# Patient Record
Sex: Female | Born: 1985 | Race: Black or African American | Hispanic: No | Marital: Married | Smoking: Never smoker
Health system: Southern US, Community
[De-identification: ages and names within clinical notes are randomized; demographics above are authoritative.]

## PROBLEM LIST (undated history)

## (undated) DIAGNOSIS — T7840XA Allergy, unspecified, initial encounter: Secondary | ICD-10-CM

## (undated) DIAGNOSIS — F32A Depression, unspecified: Secondary | ICD-10-CM

## (undated) DIAGNOSIS — F419 Anxiety disorder, unspecified: Secondary | ICD-10-CM

## (undated) HISTORY — DX: Allergy, unspecified, initial encounter: T78.40XA

## (undated) HISTORY — DX: Depression, unspecified: F32.A

## (undated) HISTORY — DX: Anxiety disorder, unspecified: F41.9

## (undated) HISTORY — PX: SALPINGECTOMY: SHX328

---

## 2011-12-31 DIAGNOSIS — E669 Obesity, unspecified: Secondary | ICD-10-CM | POA: Insufficient documentation

## 2012-01-10 DIAGNOSIS — E559 Vitamin D deficiency, unspecified: Secondary | ICD-10-CM | POA: Insufficient documentation

## 2015-03-28 DIAGNOSIS — Z98891 History of uterine scar from previous surgery: Secondary | ICD-10-CM | POA: Insufficient documentation

## 2016-06-21 DIAGNOSIS — F411 Generalized anxiety disorder: Secondary | ICD-10-CM | POA: Insufficient documentation

## 2016-06-21 DIAGNOSIS — F5104 Psychophysiologic insomnia: Secondary | ICD-10-CM | POA: Insufficient documentation

## 2016-06-21 DIAGNOSIS — F330 Major depressive disorder, recurrent, mild: Secondary | ICD-10-CM | POA: Insufficient documentation

## 2016-06-21 DIAGNOSIS — F329 Major depressive disorder, single episode, unspecified: Secondary | ICD-10-CM | POA: Insufficient documentation

## 2016-10-11 ENCOUNTER — Encounter (HOSPITAL_BASED_OUTPATIENT_CLINIC_OR_DEPARTMENT_OTHER): Payer: Self-pay | Admitting: Emergency Medicine

## 2016-10-11 ENCOUNTER — Emergency Department (HOSPITAL_BASED_OUTPATIENT_CLINIC_OR_DEPARTMENT_OTHER)
Admission: EM | Admit: 2016-10-11 | Discharge: 2016-10-11 | Discharge status: Against Medical Advice | Payer: Self-pay | Attending: Emergency Medicine | Admitting: Emergency Medicine

## 2016-10-11 ENCOUNTER — Emergency Department (HOSPITAL_BASED_OUTPATIENT_CLINIC_OR_DEPARTMENT_OTHER): Payer: Self-pay

## 2016-10-11 DIAGNOSIS — Z3490 Encounter for supervision of normal pregnancy, unspecified, unspecified trimester: Secondary | ICD-10-CM

## 2016-10-11 DIAGNOSIS — R42 Dizziness and giddiness: Secondary | ICD-10-CM | POA: Insufficient documentation

## 2016-10-11 DIAGNOSIS — R110 Nausea: Secondary | ICD-10-CM | POA: Insufficient documentation

## 2016-10-11 DIAGNOSIS — O26891 Other specified pregnancy related conditions, first trimester: Secondary | ICD-10-CM | POA: Insufficient documentation

## 2016-10-11 DIAGNOSIS — R0602 Shortness of breath: Secondary | ICD-10-CM | POA: Insufficient documentation

## 2016-10-11 DIAGNOSIS — Z3A01 Less than 8 weeks gestation of pregnancy: Secondary | ICD-10-CM | POA: Insufficient documentation

## 2016-10-11 LAB — URINALYSIS, ROUTINE W REFLEX MICROSCOPIC
BILIRUBIN URINE: NEGATIVE
GLUCOSE, UA: NEGATIVE mg/dL
HGB URINE DIPSTICK: NEGATIVE
Ketones, ur: NEGATIVE mg/dL
Leukocytes, UA: NEGATIVE
Nitrite: NEGATIVE
PH: 6 (ref 5.0–8.0)
Protein, ur: NEGATIVE mg/dL
SPECIFIC GRAVITY, URINE: 1.018 (ref 1.005–1.030)

## 2016-10-11 LAB — PREGNANCY, URINE: Preg Test, Ur: POSITIVE — AB

## 2016-10-11 MED ORDER — SODIUM CHLORIDE 0.9 % IV BOLUS (SEPSIS): 1000.0000 mL | Freq: Once | INTRAVENOUS | Status: DC

## 2016-10-11 NOTE — Discharge Instructions (Signed)
It was advised that you stay in the ER and complete your workup.  Other sources of dizziness and shortness of breath are not ruled out.   These other causes include, but are not limited to hypotension, pulmonary embolism, and/or anemia.  You need to return to the ER if you have worsening symptoms, chest pain, shortness of breath, fever, abdominal pain, or bleeding.

## 2016-10-11 NOTE — ED Provider Notes (Signed)
[facility] Note   CSN: [id number] Arrival date & time: 10/11/16  1940  By signing my name below, I, Modena JanskyAlbert Thayil, attest that this documentation has been prepared under the direction and in the presence of non-physician practitioner, Roxy Horsemanobert Nickolus Wadding, PA-C. Electronically Signed: Modena JanskyAlbert Thayil, Scribe. 10/11/2016. 9:33 PM.  History   Chief Complaint Chief Complaint  Patient presents with  . Dizziness  . Shortness of Breath  . Pelvic Pain   The history is provided by the patient. No language interpreter was used.   HPI Comments: Krista Jones is a 31 y.o. female who presents to the Emergency Department complaining of intermittent light-headedness that started about 1.5 weeks ago. Her light-headedness is worse with exertion. She reports associated SOB and nausea. Her LMNP was 08/31/16. She denies any hx of DVT/PE, chest pain, vomiting, abdominal pain, dysuria, vaginal discharge, or other complaints.     PCP: Regino BellowAMOS,ARLENE G, MD  History reviewed. No pertinent past medical history.  There are no active problems to display for this patient.   History reviewed. No pertinent surgical history.  OB History    No data available       Home Medications    Prior to Admission medications   Not on File    Family History No family history on file.  Social History Social History  Substance Use Topics  . Smoking status: Never Smoker  . Smokeless tobacco: Never Used  . Alcohol use Not on file     Allergies   Patient has no known allergies.   Review of Systems Review of Systems  Respiratory: Positive for shortness of breath.   Cardiovascular: Negative for chest pain.  Gastrointestinal: Positive for nausea. Negative for abdominal pain and vomiting.  Genitourinary: Negative for dysuria and vaginal discharge.  Neurological: Positive for light-headedness.  All other systems reviewed and are negative.    Physical Exam Updated Vital Signs BP 154/62  (BP Location: Right Arm)   Pulse 93   Temp 98.5 F (36.9 C) (Oral)   Resp 22   Ht 5\' 1"  (1.549 m)   Wt 260 lb (117.9 kg)   LMP 08/31/2016   SpO2 100%   BMI 49.13 kg/m   Physical Exam  Constitutional: She is oriented to person, place, and time. She appears well-developed and well-nourished. No distress.  HENT:  Head: Normocephalic and atraumatic.  Eyes: Conjunctivae and EOM are normal. Pupils are equal, round, and reactive to light.  Neck: Normal range of motion. Neck supple.  Cardiovascular: Normal rate and regular rhythm.  Exam reveals no gallop and no friction rub.   No murmur heard. Pulmonary/Chest: Effort normal and breath sounds normal. No respiratory distress. She has no wheezes. She has no rales. She exhibits no tenderness.  Abdominal: Soft. Bowel sounds are normal. She exhibits no distension and no mass. There is no tenderness. There is no rebound and no guarding.  Musculoskeletal: Normal range of motion. She exhibits no edema or tenderness.  Neurological: She is alert and oriented to person, place, and time.  Skin: Skin is warm and dry.  Psychiatric: She has a normal mood and affect. Her behavior is normal. Judgment and thought content normal.  Nursing note and vitals reviewed.    ED Treatments / Results  DIAGNOSTIC STUDIES: Oxygen Saturation is 100% on RA, normal by my interpretation.    COORDINATION OF CARE: 9:37 PM- Pt advised of plan for treatment and pt agrees.  Labs (all labs ordered are listed, but only abnormal results are  displayed) Labs Reviewed  PREGNANCY, URINE - Abnormal; Notable for the following:       Result Value   Preg Test, Ur POSITIVE (*)    All other components within normal limits  URINALYSIS, ROUTINE W REFLEX MICROSCOPIC    EKG  EKG Interpretation None       Radiology Dg Chest 2 View  Result Date: 10/11/2016 CLINICAL DATA:  31 y/o  F; 3 weeks of shortness of breath. EXAM: CHEST  2 VIEW COMPARISON:  None. FINDINGS: The heart size  and mediastinal contours are within normal limits. Both lungs are clear. The visualized skeletal structures are unremarkable. IMPRESSION: No active cardiopulmonary disease. Electronically Signed   By: Mitzi Hansen M.D.   On: 10/11/2016 20:14    Procedures Procedures (including critical care time)  Medications Ordered in ED Medications - No data to display   Initial Impression / Assessment and Plan / ED Course  I have reviewed the triage vital signs and the nursing notes.  Pertinent labs & imaging results that were available during my care of the patient were reviewed by me and considered in my medical decision making (see chart for details).     Patient with dizziness and nausea.  She denies any abdominal pain, pelvic pain, or vaginal bleeding or discharge.  She denies any CP or SOB now.  Denies any history of PE/DVT.  Pregnancy test is positive.  Recommend that we check labs and reassess.  Upon finding that she has a positive pregnancy test, she states that she would like to leave.  I discussed the risks of the patient leaving without finishing her workup.  She is of sound mind and has capacity to make decisions for herself.   Patients that they would like to leave.  I have discussed my concerns with the patient about them leaving without completing the evaluation.   Patient has capacity to make own medical decisions.  Patient presents with dizziness/lightheadedness  Symptoms include: dizziness, improved now, nausea (daily) SOB and intermittent pelvic cramps none of which are present now.  Concern for: that symptoms may be related to more than just pregnancy  Study limitations and other tests offered include: may need labs and [place]  Treatment and recommended follow-up include: blood work and pelvic exam.  I do not feel that the patient should leave prior to completing their workup. I have discussed the above symptoms, initial findings, study limitations, and treatment  plan with the patient. Patient is not altered, does not have any distracting issues, and has capacity to make decisions for themselves. Patient places themselves at risk of worsening symptoms, disability, morbidity and/or death.   Final Clinical Impressions(s) / ED Diagnoses   Final diagnoses:  Pregnancy, unspecified gestational age  Dizziness    New Prescriptions There are no discharge medications for this patient.  I personally performed the services described in this documentation, which was scribed in my presence. The recorded information has been reviewed and is accurate.       Roxy Horseman, PA-C 10/15/16 1514    Canary Brim Tegeler, MD 10/15/16 [phone]

## 2016-10-11 NOTE — ED Triage Notes (Signed)
Patient states that for the last week she has had increased in SOB and Dizziness. The patient reports that she is having Nausea as well. Patient reports that she is having swelling to her hands and feet, also has pelvic pain and is "more wet" than usual

## 2016-10-11 NOTE — ED Notes (Signed)
Pt informed staff that she would like to leave, EDP spoke with her and pt has still decided to go home.  Pt will return if anything worsens.

## 2017-05-06 DIAGNOSIS — E7800 Pure hypercholesterolemia, unspecified: Secondary | ICD-10-CM | POA: Insufficient documentation

## 2017-05-06 DIAGNOSIS — E785 Hyperlipidemia, unspecified: Secondary | ICD-10-CM | POA: Insufficient documentation

## 2018-03-15 IMAGING — CR DG CHEST 2V
2 series · 2 of 2 positions shown · non-contrast
Comparison: None.

CLINICAL DATA: 30 y/o  F; 3 weeks of shortness of breath.

EXAM:
CHEST  2 VIEW

[w chest pa]
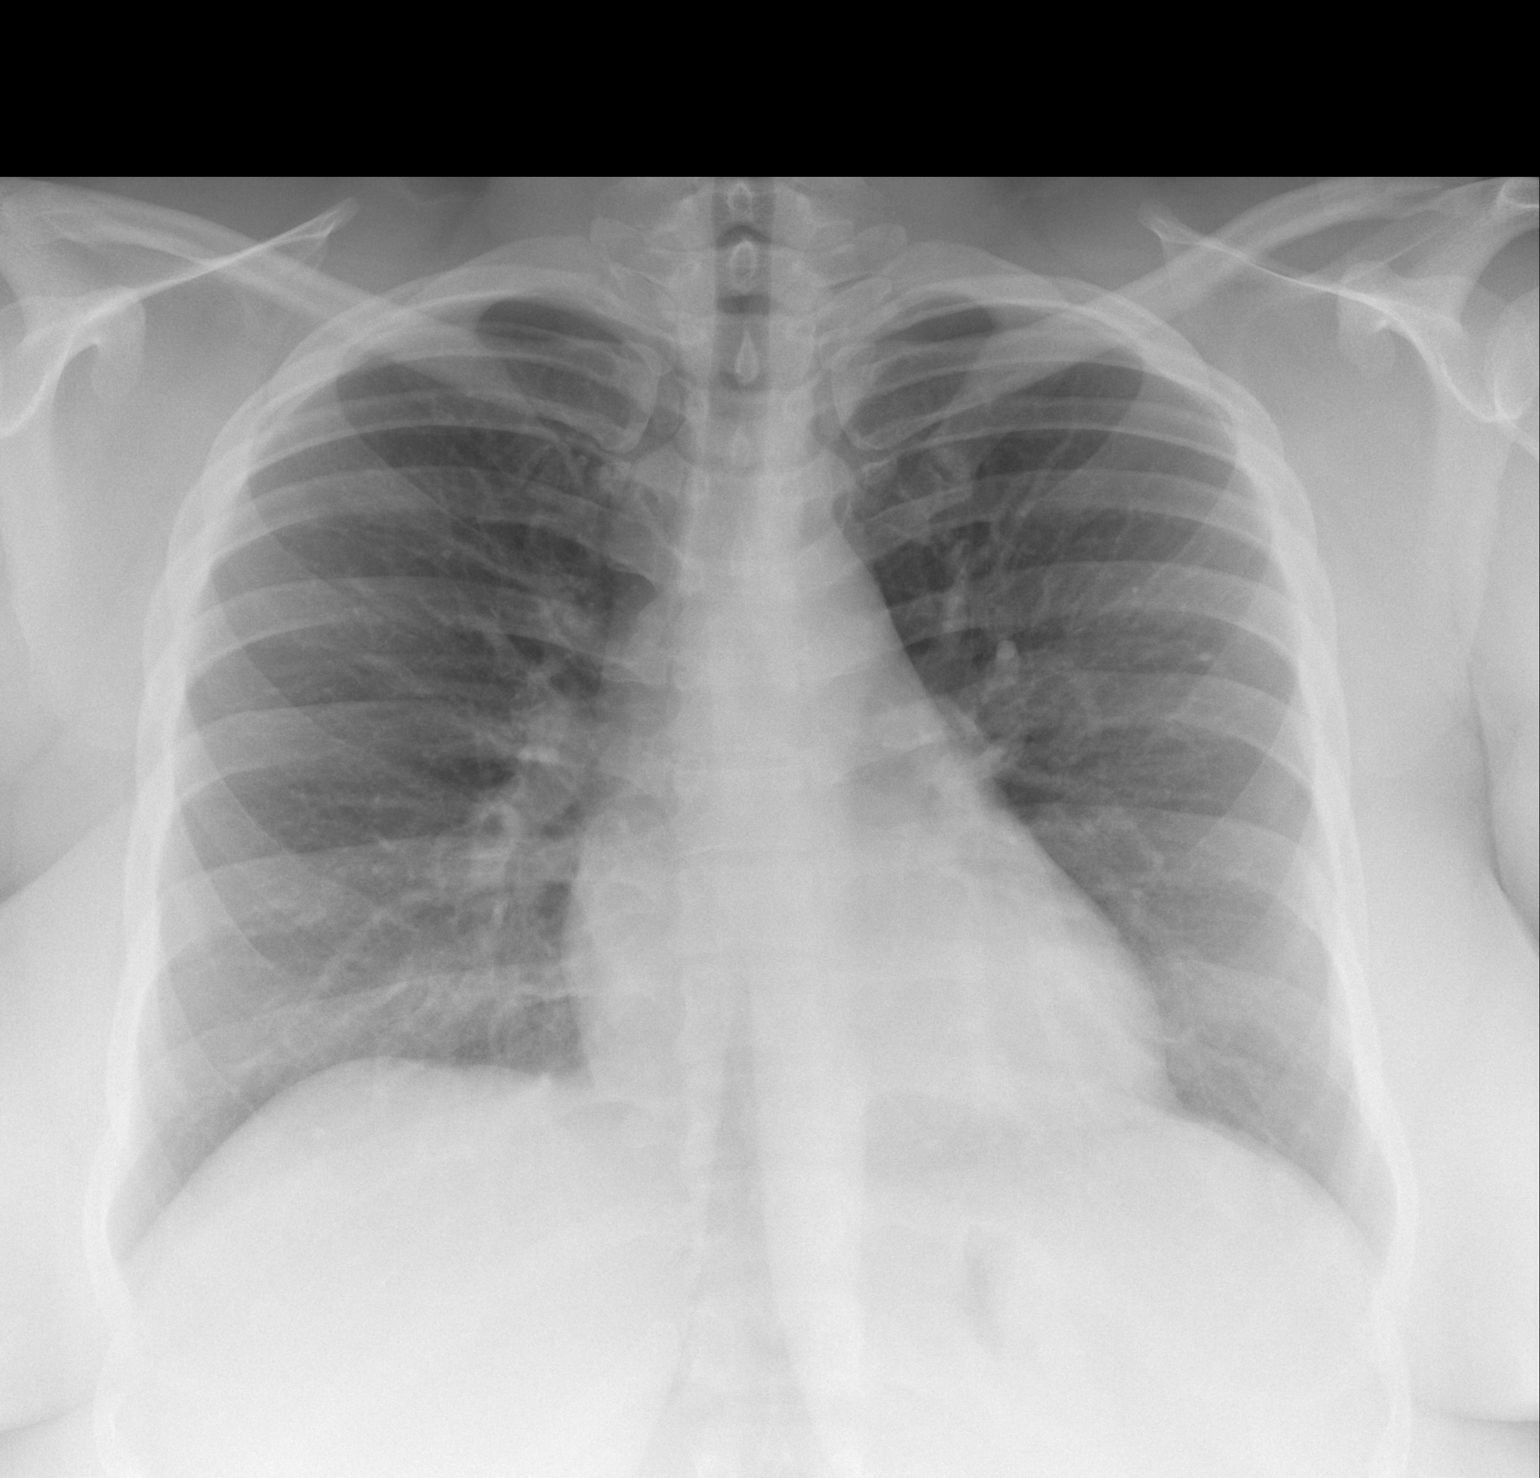

[w chest lat]
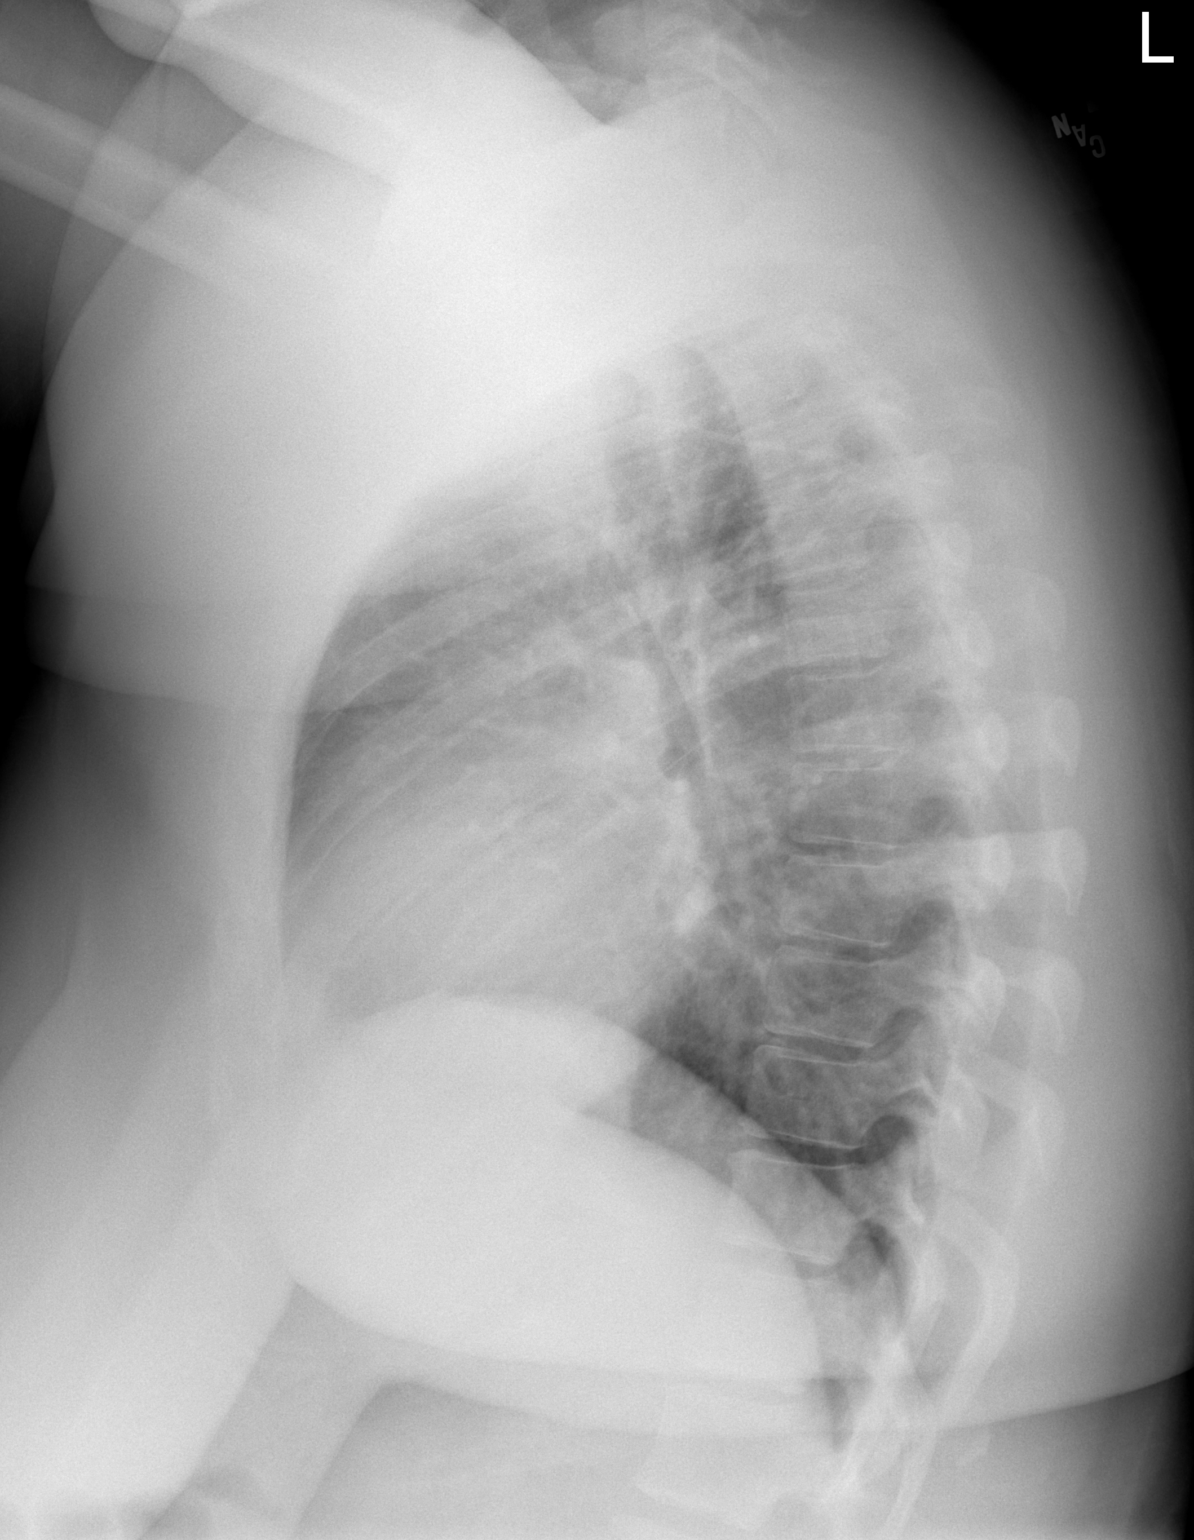

[2 of 2 positions shown; findings below may reference images not displayed]

FINDINGS: The heart size and mediastinal contours are within normal limits.
Both lungs are clear. The visualized skeletal structures are
unremarkable.
IMPRESSION: No active cardiopulmonary disease.

By: Rezzam Ipar M.D.

## 2019-06-21 ENCOUNTER — Other Ambulatory Visit: Payer: Self-pay

## 2019-06-21 DIAGNOSIS — Z20822 Contact with and (suspected) exposure to covid-19: Secondary | ICD-10-CM

## 2019-06-23 LAB — NOVEL CORONAVIRUS, NAA: SARS-CoV-2, NAA: NOT DETECTED

## 2019-11-12 DIAGNOSIS — R030 Elevated blood-pressure reading, without diagnosis of hypertension: Secondary | ICD-10-CM | POA: Insufficient documentation

## 2019-11-12 DIAGNOSIS — L509 Urticaria, unspecified: Secondary | ICD-10-CM | POA: Insufficient documentation

## 2019-11-12 DIAGNOSIS — L563 Solar urticaria: Secondary | ICD-10-CM | POA: Insufficient documentation

## 2019-11-12 DIAGNOSIS — I10 Essential (primary) hypertension: Secondary | ICD-10-CM | POA: Insufficient documentation

## 2019-11-14 DIAGNOSIS — R7303 Prediabetes: Secondary | ICD-10-CM | POA: Insufficient documentation

## 2020-04-12 ENCOUNTER — Other Ambulatory Visit: Payer: Self-pay

## 2020-04-12 DIAGNOSIS — Z20822 Contact with and (suspected) exposure to covid-19: Secondary | ICD-10-CM

## 2020-04-13 LAB — NOVEL CORONAVIRUS, NAA: SARS-CoV-2, NAA: NOT DETECTED

## 2021-04-18 ENCOUNTER — Encounter (HOSPITAL_COMMUNITY): Payer: Self-pay

## 2021-04-18 ENCOUNTER — Other Ambulatory Visit: Payer: Self-pay

## 2021-04-18 ENCOUNTER — Emergency Department (HOSPITAL_COMMUNITY)
Admission: EM | Admit: 2021-04-18 | Discharge: 2021-04-18 | Disposition: A | Discharge status: Home / Self Care | Payer: Medicaid Other | Attending: Emergency Medicine | Admitting: Emergency Medicine

## 2021-04-18 DIAGNOSIS — M79602 Pain in left arm: Secondary | ICD-10-CM | POA: Insufficient documentation

## 2021-04-18 DIAGNOSIS — M25512 Pain in left shoulder: Secondary | ICD-10-CM | POA: Insufficient documentation

## 2021-04-18 DIAGNOSIS — Y92410 Unspecified street and highway as the place of occurrence of the external cause: Secondary | ICD-10-CM | POA: Insufficient documentation

## 2021-04-18 MED ORDER — METHOCARBAMOL 500 MG PO TABS: 500.0000 mg | ORAL_TABLET | Freq: Two times a day (BID) | ORAL | 0 refills | Status: AC
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
## 2021-04-18 NOTE — Discharge Instructions (Signed)
Take Robaxin as needed as prescribed for muscle pain.  You may also take Tylenol and ibuprofen. Robaxin and ibuprofen may only be taken if not pregnant.  If you are pregnant, take Tylenol as directed.
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
## 2021-09-11 ENCOUNTER — Ambulatory Visit (INDEPENDENT_AMBULATORY_CARE_PROVIDER_SITE_OTHER): Payer: Self-pay | Admitting: Nurse Practitioner

## 2021-09-11 ENCOUNTER — Encounter: Payer: Self-pay | Admitting: Nurse Practitioner

## 2021-09-11 ENCOUNTER — Telehealth: Payer: Self-pay

## 2021-09-11 ENCOUNTER — Other Ambulatory Visit: Payer: Self-pay

## 2021-09-11 VITALS — BP 143/89 | HR 70 | Temp 98.0°F | Ht 61.0 in | Wt 254.2 lb

## 2021-09-11 DIAGNOSIS — Z0000 Encounter for general adult medical examination without abnormal findings: Secondary | ICD-10-CM

## 2021-09-11 DIAGNOSIS — F419 Anxiety disorder, unspecified: Secondary | ICD-10-CM

## 2021-09-11 DIAGNOSIS — E782 Mixed hyperlipidemia: Secondary | ICD-10-CM

## 2021-09-11 DIAGNOSIS — F3289 Other specified depressive episodes: Secondary | ICD-10-CM

## 2021-09-11 DIAGNOSIS — F900 Attention-deficit hyperactivity disorder, predominantly inattentive type: Secondary | ICD-10-CM

## 2021-09-11 DIAGNOSIS — I10 Essential (primary) hypertension: Secondary | ICD-10-CM

## 2021-09-11 LAB — POCT URINALYSIS DIP (CLINITEK)
Bilirubin, UA: NEGATIVE
Blood, UA: NEGATIVE
Glucose, UA: NEGATIVE mg/dL
Ketones, POC UA: NEGATIVE mg/dL
Leukocytes, UA: NEGATIVE
Nitrite, UA: NEGATIVE
POC PROTEIN,UA: NEGATIVE
Spec Grav, UA: >=1.03 — AB (ref 1.010–1.025)
Urobilinogen, UA: 0.2 E.U./dL
pH, UA: 6.5 (ref 5.0–8.0)

## 2021-09-11 MED ORDER — ESCITALOPRAM OXALATE 20 MG PO TABS: 20.0000 mg | ORAL_TABLET | Freq: Every day | ORAL | 5 refills | Status: DC

## 2021-09-11 MED ORDER — LOSARTAN POTASSIUM 25 MG PO TABS: 25.0000 mg | ORAL_TABLET | Freq: Every day | ORAL | 0 refills | Status: DC

## 2021-09-11 MED ORDER — LISDEXAMFETAMINE DIMESYLATE 10 MG PO CAPS: 30.0000 mg | ORAL_CAPSULE | Freq: Every day | ORAL | 0 refills | Status: DC

## 2021-09-11 MED ORDER — LISDEXAMFETAMINE DIMESYLATE 30 MG PO CAPS: 30.0000 mg | ORAL_CAPSULE | Freq: Every day | ORAL | 0 refills | Status: DC

## 2021-09-11 NOTE — Progress Notes (Signed)
[address] [place], [place]  [address] Phone:  [phone]   Fax:  [phone] Subjective:   Patient ID: Krista Jones, female    DOB: September 14, 1985, 36 y.o.   MRN: [id number]  Chief Complaint  Patient presents with   Establish Care    Pt is here to establish care. Pt stated she is concerned about her ADHD anxiety,depression weight,sugar pt has not been taking any medications    HPI Krista Jones 36 y.o. female  has a past medical history of Allergy (10/2019), Anxiety (02/2011), and Depression (02/2011).  To the [facility] to establish care. Last visit with PCP in 2019, due to financial constraints.  Patient requesting refill of depression, anxiety and ADHD medications, last dosage 2018. States that her ADHD symptoms frequently interfere with her complete everyday task. Denies monitoring diet and/ exercising. States that she intends to begin making more efforts towards a healthy lifestyle in the upcoming weeks. Denies any substance and alcohol usage. Denies any other complaints today. Denies any fever.  Denies any fatigue, chest pain, shortness of breath, HA or dizziness. Denies any blurred vision, numbness or tingling.  Past Medical History:  Diagnosis Date   Allergy 10/2019   Anxiety 02/2011   Depression 02/2011    Past Surgical History:  Procedure Laterality Date   CESAREAN SECTION  03/26/2015    Family History  Problem Relation Age of Onset   ADD / ADHD Mother    Anxiety disorder Mother    Depression Mother    Hypertension Mother    Obesity Mother    Alcohol abuse Father    Asthma Daughter     Social History   Socioeconomic History   Marital status: Married    Spouse name: Not on file   Number of children: Not on file   Years of education: Not on file   Highest education level: Not on file  Occupational History   Not on file  Tobacco Use   Smoking status: Never   Smokeless tobacco: Never  Vaping Use   Vaping Use: Never used   Substance and Sexual Activity   Alcohol use: Never   Drug use: Never   Sexual activity: Yes  Other Topics Concern   Not on file  Social History Narrative   Not on file   Social Determinants of Health   Financial Resource Strain: Not on file  Food Insecurity: Not on file  Transportation Needs: Not on file  Physical Activity: Not on file  Stress: Not on file  Social Connections: Not on file  Intimate Partner Violence: Not on file    Outpatient Medications Prior to Visit  Medication Sig Dispense Refill   methocarbamol (ROBAXIN) 500 MG tablet Take 1 tablet (500 mg total) by mouth 2 (two) times daily. (Patient not taking: Reported on 09/11/2021) 20 tablet 0   No facility-administered medications prior to visit.    No Known Allergies  Review of Systems  Constitutional:  Negative for chills, fever and malaise/fatigue.  HENT: Negative.    Eyes: Negative.   Respiratory:  Negative for cough and shortness of breath.   Cardiovascular:  Negative for chest pain, palpitations and leg swelling.  Gastrointestinal:  Negative for abdominal pain, blood in stool, constipation, diarrhea, nausea and vomiting.  Genitourinary: Negative.   Musculoskeletal: Negative.   Skin: Negative.   Neurological: Negative.   Psychiatric/Behavioral:  Positive for depression. The patient is nervous/anxious.   All other systems reviewed and are negative.  Objective:    Physical Exam Vitals reviewed.  Constitutional:      General: She is not in acute distress.    Appearance: Normal appearance. She is obese.  HENT:     Head: Normocephalic.     Right Ear: Tympanic membrane, ear canal and external ear normal.     Left Ear: Tympanic membrane, ear canal and external ear normal.     Nose: Nose normal.     Mouth/Throat:     Mouth: Mucous membranes are moist.     Pharynx: Oropharynx is clear.  Eyes:     General: No scleral icterus.       Right eye: No discharge.        Left eye: No discharge.      Extraocular Movements: Extraocular movements intact.     Conjunctiva/sclera: Conjunctivae normal.     Pupils: Pupils are equal, round, and reactive to light.  Neck:     Vascular: No carotid bruit.  Cardiovascular:     Rate and Rhythm: Normal rate and regular rhythm.     Pulses: Normal pulses.     Heart sounds: Normal heart sounds.     Comments: No obvious peripheral edema Pulmonary:     Effort: Pulmonary effort is normal.     Breath sounds: Normal breath sounds.  Abdominal:     General: Abdomen is flat. Bowel sounds are normal. There is no distension.     Palpations: Abdomen is soft. There is no mass.     Tenderness: There is no abdominal tenderness. There is no right CVA tenderness, left CVA tenderness, guarding or rebound.     Hernia: No hernia is present.  Musculoskeletal:        General: No swelling, tenderness, deformity or signs of injury. Normal range of motion.     Cervical back: Normal range of motion and neck supple. No rigidity or tenderness.     Left lower leg: No edema.  Lymphadenopathy:     Cervical: No cervical adenopathy.  Skin:    General: Skin is warm and dry.     Capillary Refill: Capillary refill takes less than 2 seconds.  Neurological:     General: No focal deficit present.     Mental Status: She is alert and oriented to person, place, and time.  Psychiatric:        Mood and Affect: Mood normal.        Behavior: Behavior normal.        Thought Content: Thought content normal.        Judgment: Judgment normal.    BP (!) 143/89    Pulse 70    Temp 98 F (36.7 C)    Ht $R'5\' 1"'lb$  (1.549 m)    Wt 254 lb 4 oz (115.3 kg)    LMP 08/26/2021    SpO2 99%    BMI 48.04 kg/m  Wt Readings from Last 3 Encounters:  09/11/21 254 lb 4 oz (115.3 kg)  04/18/21 260 lb 2.3 oz (118 kg)  10/11/16 260 lb (117.9 kg)     There is no immunization history on file for this patient.  Diabetic Foot Exam - Simple   No data filed     No results found for: TSH Lab Results   Component Value Date   WBC 6.6 09/11/2021   HGB 13.7 09/11/2021   HCT 42.8 09/11/2021   MCV 87 09/11/2021   PLT 203 09/11/2021   Lab Results  Component Value Date   NA  142 09/11/2021   K 4.6 09/11/2021   CO2 23 09/11/2021   GLUCOSE 86 09/11/2021   BUN 16 09/11/2021   CREATININE 0.94 09/11/2021   BILITOT <0.2 09/11/2021   ALKPHOS 57 09/11/2021   AST 10 09/11/2021   ALT 13 09/11/2021   PROT 7.1 09/11/2021   ALBUMIN 4.5 09/11/2021   CALCIUM 9.8 09/11/2021   EGFR 82 09/11/2021   Lab Results  Component Value Date   CHOL 212 (H) 09/11/2021   Lab Results  Component Value Date   HDL 39 (L) 09/11/2021   Lab Results  Component Value Date   LDLCALC 143 (H) 09/11/2021   Lab Results  Component Value Date   TRIG 163 (H) 09/11/2021   Lab Results  Component Value Date   CHOLHDL 5.4 (H) 09/11/2021   Lab Results  Component Value Date   HGBA1C 5.8 (H) 09/11/2021       Assessment & Plan:   Problem List Items Addressed This Visit   None Visit Diagnoses     Encounter for wellness examination in adult    -  Primary   Relevant Orders   CBC with Differential/Platelet (Completed)   Comprehensive metabolic panel (Completed)   Lipid panel (Completed)   Hemoglobin A1c (Completed): 5.8, prediabetic, will continue to monitor   POCT URINALYSIS DIP (CLINITEK) (Completed)   Attention deficit hyperactivity disorder (ADHD), predominantly inattentive type       Relevant Medications   lisdexamfetamine (VYVANSE) 30 MG capsule, therapy initiated based on history and positive screening  Screening completed with positive results   Anxiety       Relevant Medications   escitalopram (LEXAPRO) 20 MG tablet   Other depression       Relevant Medications   escitalopram (LEXAPRO) 20 MG tablet, medication restarted    Primary hypertension       Relevant Medications   losartan (COZAAR) 25 MG tablet, therapy initiated    atorvastatin (LIPITOR) 20 MG tablet Encouraged continued diet and  exercise efforts  Encouraged continued compliance with medication     Mixed hyperlipidemia       Relevant Medications   losartan (COZAAR) 25 MG tablet   atorvastatin (LIPITOR) 20 MG tablet   Follow up in 1 wk, virtual visit for reevaluation of medication, 1 mth for pap smear     I have changed Krista Jones's lisdexamfetamine. I am also having her start on escitalopram, losartan, and atorvastatin. Additionally, I am having her maintain her methocarbamol.  Meds ordered this encounter  Medications   DISCONTD: lisdexamfetamine (VYVANSE) 10 MG capsule    Sig: Take 3 capsules (30 mg total) by mouth daily.    Dispense:  90 capsule    Refill:  0   escitalopram (LEXAPRO) 20 MG tablet    Sig: Take 1 tablet (20 mg total) by mouth daily.    Dispense:  30 tablet    Refill:  5   losartan (COZAAR) 25 MG tablet    Sig: Take 1 tablet (25 mg total) by mouth daily.    Dispense:  30 tablet    Refill:  0   lisdexamfetamine (VYVANSE) 30 MG capsule    Sig: Take 1 capsule (30 mg total) by mouth daily.    Dispense:  30 capsule    Refill:  0   atorvastatin (LIPITOR) 20 MG tablet    Sig: Take 1 tablet (20 mg total) by mouth daily.    Dispense:  30 tablet    Refill:  2  Refill:  1     Teena Dunk, NP

## 2021-09-11 NOTE — Telephone Encounter (Signed)
Entered in error

## 2021-09-12 LAB — CBC WITH DIFFERENTIAL/PLATELET
Basophils Absolute: 0.1 10*3/uL (ref 0.0–0.2)
Basos: 1 %
EOS (ABSOLUTE): 0.1 10*3/uL (ref 0.0–0.4)
Eos: 1 %
Hematocrit: 42.8 % (ref 34.0–46.6)
Hemoglobin: 13.7 g/dL (ref 11.1–15.9)
Immature Grans (Abs): 0 10*3/uL (ref 0.0–0.1)
Immature Granulocytes: 0 %
Lymphocytes Absolute: 3.1 10*3/uL (ref 0.7–3.1)
Lymphs: 47 %
MCH: 27.8 pg (ref 26.6–33.0)
MCHC: 32 g/dL (ref 31.5–35.7)
MCV: 87 fL (ref 79–97)
Monocytes Absolute: 0.5 10*3/uL (ref 0.1–0.9)
Monocytes: 8 %
Neutrophils Absolute: 2.9 10*3/uL (ref 1.4–7.0)
Neutrophils: 43 %
Platelets: 203 10*3/uL (ref 150–450)
RBC: 4.92 x10E6/uL (ref 3.77–5.28)
RDW: 13.1 % (ref 11.7–15.4)
WBC: 6.6 10*3/uL (ref 3.4–10.8)

## 2021-09-12 LAB — LIPID PANEL
Chol/HDL Ratio: 5.4 ratio — ABNORMAL HIGH (ref 0.0–4.4)
Cholesterol, Total: 212 mg/dL — ABNORMAL HIGH (ref 100–199)
HDL: 39 mg/dL — ABNORMAL LOW (ref >39)
LDL Chol Calc (NIH): 143 mg/dL — ABNORMAL HIGH (ref 0–99)
Triglycerides: 163 mg/dL — ABNORMAL HIGH (ref 0–149)
VLDL Cholesterol Cal: 30 mg/dL (ref 5–40)

## 2021-09-12 LAB — COMPREHENSIVE METABOLIC PANEL
ALT: 13 IU/L (ref 0–32)
AST: 10 IU/L (ref 0–40)
Albumin/Globulin Ratio: 1.7 (ref 1.2–2.2)
Albumin: 4.5 g/dL (ref 3.8–4.8)
Alkaline Phosphatase: 57 IU/L (ref 44–121)
BUN/Creatinine Ratio: 17 (ref 9–23)
BUN: 16 mg/dL (ref 6–20)
Bilirubin Total: <0.2 mg/dL (ref 0.0–1.2)
CO2: 23 mmol/L (ref 20–29)
Calcium: 9.8 mg/dL (ref 8.7–10.2)
Chloride: 101 mmol/L (ref 96–106)
Creatinine, Ser: 0.94 mg/dL (ref 0.57–1.00)
Globulin, Total: 2.6 g/dL (ref 1.5–4.5)
Glucose: 86 mg/dL (ref 70–99)
Potassium: 4.6 mmol/L (ref 3.5–5.2)
Sodium: 142 mmol/L (ref 134–144)
Total Protein: 7.1 g/dL (ref 6.0–8.5)
eGFR: 82 mL/min/{1.73_m2} (ref >59)

## 2021-09-12 LAB — HEMOGLOBIN A1C
Est. average glucose Bld gHb Est-mCnc: 120 mg/dL
Hgb A1c MFr Bld: 5.8 % — ABNORMAL HIGH (ref 4.8–5.6)

## 2021-09-12 MED ORDER — ATORVASTATIN CALCIUM 20 MG PO TABS: 20.0000 mg | ORAL_TABLET | Freq: Every day | ORAL | 2 refills | Status: DC

## 2021-09-13 ENCOUNTER — Other Ambulatory Visit: Payer: Self-pay | Admitting: Nurse Practitioner

## 2021-09-13 ENCOUNTER — Telehealth: Payer: Self-pay

## 2021-09-13 DIAGNOSIS — I10 Essential (primary) hypertension: Secondary | ICD-10-CM

## 2021-09-14 ENCOUNTER — Other Ambulatory Visit: Payer: Self-pay | Admitting: Nurse Practitioner

## 2021-09-14 DIAGNOSIS — I10 Essential (primary) hypertension: Secondary | ICD-10-CM

## 2021-09-18 ENCOUNTER — Encounter: Payer: Medicaid Other | Admitting: Nurse Practitioner

## 2021-09-18 ENCOUNTER — Other Ambulatory Visit: Payer: Self-pay

## 2021-09-21 NOTE — Progress Notes (Signed)
This encounter was created in error - please disregard.

## 2021-10-04 ENCOUNTER — Other Ambulatory Visit: Payer: Self-pay | Admitting: Nurse Practitioner

## 2021-10-04 DIAGNOSIS — F3289 Other specified depressive episodes: Secondary | ICD-10-CM

## 2021-10-04 DIAGNOSIS — F419 Anxiety disorder, unspecified: Secondary | ICD-10-CM

## 2021-10-06 ENCOUNTER — Other Ambulatory Visit: Payer: Self-pay | Admitting: Nurse Practitioner

## 2021-10-06 DIAGNOSIS — E782 Mixed hyperlipidemia: Secondary | ICD-10-CM

## 2021-10-08 NOTE — Telephone Encounter (Signed)
No additional notes needed  

## 2021-10-09 ENCOUNTER — Ambulatory Visit: Payer: Medicaid Other | Admitting: Nurse Practitioner

## 2021-10-26 ENCOUNTER — Telehealth: Payer: Self-pay | Admitting: Nurse Practitioner

## 2021-10-29 ENCOUNTER — Encounter: Payer: Self-pay | Admitting: Nurse Practitioner

## 2021-10-29 ENCOUNTER — Ambulatory Visit (INDEPENDENT_AMBULATORY_CARE_PROVIDER_SITE_OTHER): Payer: BLUE CROSS/BLUE SHIELD | Admitting: Nurse Practitioner

## 2021-10-29 ENCOUNTER — Other Ambulatory Visit: Payer: Self-pay

## 2021-10-29 VITALS — BP 150/99 | Temp 97.6°F | Ht 61.0 in | Wt 257.2 lb

## 2021-10-29 DIAGNOSIS — Z7689 Persons encountering health services in other specified circumstances: Secondary | ICD-10-CM | POA: Diagnosis not present

## 2021-10-29 DIAGNOSIS — I10 Essential (primary) hypertension: Secondary | ICD-10-CM

## 2021-10-29 DIAGNOSIS — Z9109 Other allergy status, other than to drugs and biological substances: Secondary | ICD-10-CM | POA: Diagnosis not present

## 2021-10-29 DIAGNOSIS — K5909 Other constipation: Secondary | ICD-10-CM | POA: Diagnosis not present

## 2021-10-29 MED ORDER — POLYETHYLENE GLYCOL 3350 17 GM/SCOOP PO POWD: 17.0000 g | Freq: Two times a day (BID) | ORAL | 1 refills | Status: AC | PRN

## 2021-10-29 MED ORDER — LOSARTAN POTASSIUM-HCTZ 50-12.5 MG PO TABS: 1.0000 | ORAL_TABLET | Freq: Every day | ORAL | 2 refills | Status: DC

## 2021-10-29 NOTE — Patient Instructions (Addendum)
You were seen today in the [facility] for follow up and weight management. You were prescribed medications, please take as directed. Please follow up in 1 mth for weight management and pap smear. ?

## 2021-10-29 NOTE — Progress Notes (Signed)
? ?[facility] ?[place], [place]  [address] ?Phone:  [phone]   Fax:  [phone] ?Subjective:  ? Patient ID: Krista Jones, female    DOB: 06-02-1986, 36 y.o.   MRN: [id number] ? ?Chief Complaint  ?Patient presents with  ? Follow-up  ?  Pt stated she is concern with her weight still have headaches being in the sun makes her nauseous and breakout in hives. Pt stated she still didn't get her vyvanse also pt hasn't been able to mover her bowels  ? ?HPI ?Krista Jones 36 y.o. female  has a past medical history of Allergy (10/2019), Anxiety (02/2011), and Depression (02/2011). To the [facility] for reevaluation of chronic illness. ? ?Hypertension: Patient here for follow-up of elevated blood pressure. She is exercising and is adherent to low salt diet.  Does not monitor B/P at home. Cardiac symptoms chest pain, dyspnea, fatigue, and near-syncope. Patient denies none.  Cardiovascular risk factors: dyslipidemia and hypertension. Use of agents associated with hypertension: none. History of target organ damage: none. Patient currently compliant with all medications, states that she did not take B/P medications today, due to concern for needing blood work. ? ?Concerned for possible allergy to the Sun, states that she develops hives and nausea, with HA . Suspects that she may also have other allergies. Requesting referral to allergy specialist. States that she was evaluated and treated in the past for Sun allergy, with management of symptoms.  ? ?Has not received ADHD medication, requesting substitute since Vyvanse is not currently covered by insurance. Patient would also like medication for weight management. States that she has had a long term struggle with weight loss, even with diet and exercise.  ? ?Also concerned about constipation. Last BM 3-4 days ago. Denies any abdominal pain. States that when she has a bowel movement, its only a small amount. Denies any other concerns today.  Denies any fever. ? ?Denies any fatigue, chest pain, shortness of breath, HA or dizziness. Denies any blurred vision, numbness or tingling. ? ? ?Past Medical History:  ?Diagnosis Date  ? Allergy 10/2019  ? Anxiety 02/2011  ? Depression 02/2011  ? ? ?Past Surgical History:  ?Procedure Laterality Date  ? CESAREAN SECTION  03/26/2015  ? ? ?Family History  ?Problem Relation Age of Onset  ? ADD / ADHD Mother   ? Anxiety disorder Mother   ? Depression Mother   ? Hypertension Mother   ? Obesity Mother   ? Alcohol abuse Father   ? Asthma Daughter   ? ? ?Social History  ? ?Socioeconomic History  ? Marital status: Married  ?  Spouse name: Not on file  ? Number of children: Not on file  ? Years of education: Not on file  ? Highest education level: Not on file  ?Occupational History  ? Not on file  ?Tobacco Use  ? Smoking status: Never  ? Smokeless tobacco: Never  ?Vaping Use  ? Vaping Use: Never used  ?Substance and Sexual Activity  ? Alcohol use: Never  ? Drug use: Never  ? Sexual activity: Yes  ?Other Topics Concern  ? Not on file  ?Social History Narrative  ? Not on file  ? ?Social Determinants of Health  ? ?Financial Resource Strain: Not on file  ?Food Insecurity: Not on file  ?Transportation Needs: Not on file  ?Physical Activity: Not on file  ?Stress: Not on file  ?Social Connections: Not on file  ?Intimate Partner Violence: Not on file  ? ? ?  Outpatient Medications Prior to Visit  ?Medication Sig Dispense Refill  ? atorvastatin (LIPITOR) 20 MG tablet TAKE 1 TABLET BY MOUTH EVERY DAY 30 tablet 2  ? escitalopram (LEXAPRO) 20 MG tablet TAKE 1 TABLET BY MOUTH EVERY DAY 90 tablet 2  ? methocarbamol (ROBAXIN) 500 MG tablet Take 1 tablet (500 mg total) by mouth 2 (two) times daily. (Patient not taking: Reported on 09/11/2021) 20 tablet 0  ? lisdexamfetamine (VYVANSE) 30 MG capsule Take 1 capsule (30 mg total) by mouth daily. 30 capsule 0  ? losartan (COZAAR) 25 MG tablet TAKE 1 TABLET (25 MG TOTAL) BY MOUTH DAILY. 30 tablet 0   ? ?No facility-administered medications prior to visit.  ? ? ?No Known Allergies ? ?Review of Systems  ?Constitutional:  Negative for chills, fever and malaise/fatigue.  ?Respiratory:  Negative for cough and shortness of breath.   ?Cardiovascular:  Negative for chest pain, palpitations and leg swelling.  ?Gastrointestinal:  Positive for constipation and nausea. Negative for abdominal pain, blood in stool, diarrhea, heartburn and vomiting.  ?Genitourinary: Negative.   ?Musculoskeletal: Negative.   ?Skin:  Negative for itching and rash.  ?     See HPI  ?Neurological:  Positive for headaches. Negative for tingling, tremors, sensory change, speech change, focal weakness, seizures, loss of consciousness and weakness.  ?Psychiatric/Behavioral:  Negative for depression. The patient is not nervous/anxious.   ?All other systems reviewed and are negative. ? ?   ?Objective:  ?  ?Physical Exam ?Constitutional:   ?   General: She is not in acute distress. ?   Appearance: Normal appearance. She is obese.  ?HENT:  ?   Head: Normocephalic.  ?Neck:  ?   Vascular: No carotid bruit.  ?Cardiovascular:  ?   Rate and Rhythm: Normal rate and regular rhythm.  ?   Pulses: Normal pulses.  ?   Heart sounds: Normal heart sounds.  ?   Comments: No obvious peripheral edema ?Pulmonary:  ?   Effort: Pulmonary effort is normal.  ?   Breath sounds: Normal breath sounds.  ?Musculoskeletal:     ?   General: No swelling, tenderness, deformity or signs of injury. Normal range of motion.  ?   Cervical back: Normal range of motion and neck supple. No rigidity or tenderness.  ?   Right lower leg: No edema.  ?   Left lower leg: No edema.  ?Lymphadenopathy:  ?   Cervical: No cervical adenopathy.  ?Skin: ?   General: Skin is warm and dry.  ?   Capillary Refill: Capillary refill takes less than 2 seconds.  ?Neurological:  ?   General: No focal deficit present.  ?   Mental Status: She is alert and oriented to person, place, and time.  ?Psychiatric:     ?    Mood and Affect: Mood normal.     ?   Behavior: Behavior normal.     ?   Thought Content: Thought content normal.     ?   Judgment: Judgment normal.  ? ? ?BP (!) 150/99 (BP Location: Right Arm, Patient Position: Sitting)   Temp 97.6 ?F (36.4 ?C)   Ht 5' 1"  (1.549 m)   Wt 257 lb 3.2 oz (116.7 kg)   SpO2 100%   BMI 48.60 kg/m?  ?Wt Readings from Last 3 Encounters:  ?10/29/21 257 lb 3.2 oz (116.7 kg)  ?09/11/21 254 lb 4 oz (115.3 kg)  ?04/18/21 260 lb 2.3 oz (118 kg)  ? ? ?Immunization History  ?  Administered Date(s) Administered  ? Unspecified SARS-COV-2 Vaccination 12/15/2019  ? ? ?Diabetic Foot Exam - Simple   ?No data filed ?  ? ? ?No results found for: TSH ?Lab Results  ?Component Value Date  ? WBC 6.6 09/11/2021  ? HGB 13.7 09/11/2021  ? HCT 42.8 09/11/2021  ? MCV 87 09/11/2021  ? PLT 203 09/11/2021  ? ?Lab Results  ?Component Value Date  ? NA 142 09/11/2021  ? K 4.6 09/11/2021  ? CO2 23 09/11/2021  ? GLUCOSE 86 09/11/2021  ? BUN 16 09/11/2021  ? CREATININE 0.94 09/11/2021  ? BILITOT <0.2 09/11/2021  ? ALKPHOS 57 09/11/2021  ? AST 10 09/11/2021  ? ALT 13 09/11/2021  ? PROT 7.1 09/11/2021  ? ALBUMIN 4.5 09/11/2021  ? CALCIUM 9.8 09/11/2021  ? EGFR 82 09/11/2021  ? ?Lab Results  ?Component Value Date  ? CHOL 212 (H) 09/11/2021  ? ?Lab Results  ?Component Value Date  ? HDL 39 (L) 09/11/2021  ? ?Lab Results  ?Component Value Date  ? LDLCALC 143 (H) 09/11/2021  ? ?Lab Results  ?Component Value Date  ? TRIG 163 (H) 09/11/2021  ? ?Lab Results  ?Component Value Date  ? CHOLHDL 5.4 (H) 09/11/2021  ? ?Lab Results  ?Component Value Date  ? HGBA1C 5.8 (H) 09/11/2021  ? ? ?   ?Assessment & Plan:  ? ?Problem List Items Addressed This Visit   ?None ?Visit Diagnoses   ? ? Primary hypertension    -  Primary  ? Relevant Medications  ? losartan-hydrochlorothiazide (HYZAAR) 50-12.5 MG tablet, initiated during visit  ?Encouraged continued diet and exercise efforts  ?Encouraged continued compliance with medication  ?Encouraged to  begin checking B/P at home  ? Sun allergy      ? Relevant Orders  ? Ambulatory referral to Allergy ?Discussed non pharmacological methods for management of symptoms ?Informed to take OTC medications as needed ?[place]

## 2021-11-17 ENCOUNTER — Other Ambulatory Visit: Payer: Self-pay | Admitting: Nurse Practitioner

## 2021-11-17 DIAGNOSIS — I10 Essential (primary) hypertension: Secondary | ICD-10-CM

## 2021-12-03 ENCOUNTER — Ambulatory Visit (INDEPENDENT_AMBULATORY_CARE_PROVIDER_SITE_OTHER): Payer: BLUE CROSS/BLUE SHIELD | Admitting: Nurse Practitioner

## 2021-12-03 ENCOUNTER — Encounter: Payer: Self-pay | Admitting: Nurse Practitioner

## 2021-12-03 VITALS — BP 124/81 | HR 85 | Temp 98.4°F | Ht 61.0 in | Wt 247.0 lb

## 2021-12-03 DIAGNOSIS — K5909 Other constipation: Secondary | ICD-10-CM | POA: Diagnosis not present

## 2021-12-03 DIAGNOSIS — Z7689 Persons encountering health services in other specified circumstances: Secondary | ICD-10-CM | POA: Diagnosis not present

## 2021-12-03 DIAGNOSIS — Z01419 Encounter for gynecological examination (general) (routine) without abnormal findings: Secondary | ICD-10-CM | POA: Diagnosis not present

## 2021-12-03 DIAGNOSIS — G43809 Other migraine, not intractable, without status migrainosus: Secondary | ICD-10-CM

## 2021-12-03 MED ORDER — BUTALBITAL-APAP-CAFFEINE 50-325-40 MG PO TABS: 1.0000 | ORAL_TABLET | Freq: Four times a day (QID) | ORAL | 0 refills | Status: AC | PRN

## 2021-12-03 NOTE — Patient Instructions (Addendum)
You were seen today in the [facility] for women's wellness exam. Labs were collected, results will be available via MyChart or, if abnormal, you will be contacted by clinic staff. You were prescribed medications, please take as directed. Please follow up in 1 mth for weight management.  ? ?Please begin checking B/P at home, is it gets below 90/60, please notify the clinic. ? ?Please try [employer] natural detox for constipation. It is sold in the produce section of the grocery store. ?

## 2021-12-03 NOTE — Progress Notes (Signed)
? ?[facility] ?[place], [place]  [address] ?Phone:  [phone]   Fax:  [phone] ?Subjective:  ? Patient ID: Krista Jones, female    DOB: 18-Jun-1986, 36 y.o.   MRN: [id number] ? ?Chief Complaint  ?Patient presents with  ? Follow-up  ?  Pt is here for 1 month follow up. Pt stated she has been having terrible migraines.  ? ?HPI ?Krista Jones 36 y.o. female  has a past medical history of Allergy (10/2019), Anxiety (02/2011), and Depression (02/2011). To the [facility] for women's wellness exam and weight management. ? ?Patient endorses having two partners in the past 6 mths, one unprotected. Denies any vaginal concerns today. States that she regularly completes SBE at home.  ? ?Concerned today about resurgence migraine headaches. States that she get them yearly during the summer in the response to the increased sun exposure. ? ?Also concerned about constipation, states that she has been taking prescribed Miralax with no improvement in symptoms. Has been compliant with weight management plan, diet and exercise, with weight loss of 10 lbs. States that she is motivated to lose an additional 10 lbs.  ? ?Denies any other concerns today. Denies any fatigue, chest pain, shortness of breath or dizziness. Denies any blurred vision, numbness or tingling. ? ? ?Past Medical History:  ?Diagnosis Date  ? Allergy 10/2019  ? Anxiety 02/2011  ? Depression 02/2011  ? ? ?Past Surgical History:  ?Procedure Laterality Date  ? CESAREAN SECTION  03/26/2015  ? ? ?Family History  ?Problem Relation Age of Onset  ? ADD / ADHD Mother   ? Anxiety disorder Mother   ? Depression Mother   ? Hypertension Mother   ? Obesity Mother   ? Alcohol abuse Father   ? Asthma Daughter   ? ? ?Social History  ? ?Socioeconomic History  ? Marital status: Married  ?  Spouse name: Not on file  ? Number of children: Not on file  ? Years of education: Not on file  ? Highest education level: Not on file  ?Occupational History  ? Not  on file  ?Tobacco Use  ? Smoking status: Never  ? Smokeless tobacco: Never  ?Vaping Use  ? Vaping Use: Never used  ?Substance and Sexual Activity  ? Alcohol use: Never  ? Drug use: Never  ? Sexual activity: Yes  ?Other Topics Concern  ? Not on file  ?Social History Narrative  ? Not on file  ? ?Social Determinants of Health  ? ?Financial Resource Strain: Not on file  ?Food Insecurity: Not on file  ?Transportation Needs: Not on file  ?Physical Activity: Not on file  ?Stress: Not on file  ?Social Connections: Not on file  ?Intimate Partner Violence: Not on file  ? ? ?Outpatient Medications Prior to Visit  ?Medication Sig Dispense Refill  ? atorvastatin (LIPITOR) 20 MG tablet TAKE 1 TABLET BY MOUTH EVERY DAY 30 tablet 2  ? escitalopram (LEXAPRO) 20 MG tablet TAKE 1 TABLET BY MOUTH EVERY DAY 90 tablet 2  ? losartan-hydrochlorothiazide (HYZAAR) 50-12.5 MG tablet Take 1 tablet by mouth daily. 30 tablet 2  ? polyethylene glycol powder (GLYCOLAX/MIRALAX) 17 GM/SCOOP powder Take 17 g by mouth 2 (two) times daily as needed for mild constipation, moderate constipation or severe constipation. 3350 g 1  ? methocarbamol (ROBAXIN) 500 MG tablet Take 1 tablet (500 mg total) by mouth 2 (two) times daily. (Patient not taking: Reported on 09/11/2021) 20 tablet 0  ? ?No facility-administered  medications prior to visit.  ? ? ?Allergies  ?Allergen Reactions  ? Penicillins Itching  ?  Pt stated it causes itching and yeast infections  ? ? ?Review of Systems  ?Constitutional:  Positive for weight loss. Negative for chills, fever and malaise/fatigue.  ?Respiratory:  Negative for cough and shortness of breath.   ?Cardiovascular:  Negative for chest pain, palpitations and leg swelling.  ?Gastrointestinal:  Positive for constipation. Negative for abdominal pain, blood in stool, diarrhea, nausea and vomiting.  ?Genitourinary: Negative.   ?Musculoskeletal: Negative.   ?Skin: Negative.   ?Neurological:  Positive for headaches. Negative for  dizziness, tingling, tremors, sensory change, speech change, focal weakness, seizures, loss of consciousness and weakness.  ?Psychiatric/Behavioral:  Negative for depression. The patient is not nervous/anxious.   ?All other systems reviewed and are negative. ? ?   ?Objective:  ?  ?Physical Exam ?Vitals reviewed. Exam conducted with a chaperone present.  ?Constitutional:   ?   General: She is not in acute distress. ?   Appearance: Normal appearance. She is obese.  ?HENT:  ?   Head: Normocephalic.  ?Cardiovascular:  ?   Rate and Rhythm: Normal rate and regular rhythm.  ?   Pulses: Normal pulses.  ?   Heart sounds: Normal heart sounds.  ?   Comments: No obvious peripheral edema ?Pulmonary:  ?   Effort: Pulmonary effort is normal.  ?   Breath sounds: Normal breath sounds.  ?Chest:  ?Breasts: ?   Right: Normal.  ?   Left: Normal.  ?Genitourinary: ?   Labia:     ?   Right: No rash, tenderness, lesion or injury.     ?   Left: No rash, tenderness, lesion or injury.   ?   Vagina: No signs of injury and foreign body. Vaginal discharge present. No erythema, tenderness, bleeding, lesions or prolapsed vaginal walls.  ?   Cervix: No cervical motion tenderness.  ?   Rectum: Normal.  ?   Comments: Small amount of white discharge noted  ?Lymphadenopathy:  ?   Upper Body:  ?   Right upper body: No axillary adenopathy.  ?   Left upper body: No axillary adenopathy.  ?Skin: ?   General: Skin is warm and dry.  ?   Capillary Refill: Capillary refill takes less than 2 seconds.  ?Neurological:  ?   Mental Status: She is alert.  ?Psychiatric:     ?   Mood and Affect: Mood normal.     ?   Behavior: Behavior normal.     ?   Thought Content: Thought content normal.     ?   Judgment: Judgment normal.  ? ? ?BP 124/81 (BP Location: Right Arm, Patient Position: Sitting, Cuff Size: Large)   Pulse 85   Temp 98.4 ?F (36.9 ?C)   Ht _0  (1.549 m)   Wt 247 lb (112 kg)   SpO2 100%   BMI 46.67 kg/m?  ?Wt Readings from Last 3 Encounters:  ?12/03/21  247 lb (112 kg)  ?10/29/21 257 lb 3.2 oz (116.7 kg)  ?09/11/21 254 lb 4 oz (115.3 kg)  ? ? ?Immunization History  ?Administered Date(s) Administered  ? Unspecified SARS-COV-2 Vaccination 12/15/2019  ? ? ?Diabetic Foot Exam - Simple   ?No data filed ?  ? ? ?No results found for: TSH ?Lab Results  ?Component Value Date  ? WBC 6.6 09/11/2021  ? HGB 13.7 09/11/2021  ? HCT 42.8 09/11/2021  ? MCV 87 09/11/2021  ?  PLT 203 09/11/2021  ? ?Lab Results  ?Component Value Date  ? NA 142 09/11/2021  ? K 4.6 09/11/2021  ? CO2 23 09/11/2021  ? GLUCOSE 86 09/11/2021  ? BUN 16 09/11/2021  ? CREATININE 0.94 09/11/2021  ? BILITOT <0.2 09/11/2021  ? ALKPHOS 57 09/11/2021  ? AST 10 09/11/2021  ? ALT 13 09/11/2021  ? PROT 7.1 09/11/2021  ? ALBUMIN 4.5 09/11/2021  ? CALCIUM 9.8 09/11/2021  ? EGFR 82 09/11/2021  ? ?Lab Results  ?Component Value Date  ? CHOL 212 (H) 09/11/2021  ? ?Lab Results  ?Component Value Date  ? HDL 39 (L) 09/11/2021  ? ?Lab Results  ?Component Value Date  ? LDLCALC 143 (H) 09/11/2021  ? ?Lab Results  ?Component Value Date  ? TRIG 163 (H) 09/11/2021  ? ?Lab Results  ?Component Value Date  ? CHOLHDL 5.4 (H) 09/11/2021  ? ?Lab Results  ?Component Value Date  ? HGBA1C 5.8 (H) 09/11/2021  ? ? ?   ?Assessment & Plan:  ? ?Problem List Items Addressed This Visit   ?None ?Visit Diagnoses   ? ? Women's annual routine gynecological examination    -  Primary  ? Relevant Orders  ? Pap IG and Chlamydia/Gonococcus, NAA ([facility])  ? NuSwab Vaginitis Plus (VG+) ?Discussed safe sex practices  ? Encounter for weight management      ? Relevant Orders  ? TSH+T4F+T3Free ?Encouraged to maintain diet and exercise regimen established ?Reintroduced brown rice is small amounts, 2-3 x per week  ? Other migraine without status migrainosus, not intractable      ? Relevant Medications  ? butalbital-acetaminophen-caffeine (FIORICET) 50-325-40 MG tablet, initiated during visit ?Discussed non pharmacological methods for management of  symptoms ?Informed to take OTC medications as needed ?  ? Other constipation     ?Discussed non pharmacological methods for management of symptoms ?Informed to take OTC medications as needed ?  ? ?Follow up in 1 mth for [facility]

## 2021-12-04 LAB — TSH+T4F+T3FREE
Free T4: 1.23 ng/dL (ref 0.82–1.77)
T3, Free: 3 pg/mL (ref 2.0–4.4)
TSH: 1.88 u[IU]/mL (ref 0.450–4.500)

## 2021-12-06 LAB — NUSWAB VAGINITIS PLUS (VG+)
Atopobium vaginae: HIGH Score — AB
BVAB 2: HIGH Score — AB
Candida albicans, NAA: NEGATIVE
Candida glabrata, NAA: NEGATIVE
Chlamydia trachomatis, NAA: NEGATIVE
Megasphaera 1: HIGH Score — AB
Neisseria gonorrhoeae, NAA: NEGATIVE
Trich vag by NAA: NEGATIVE

## 2021-12-07 ENCOUNTER — Other Ambulatory Visit: Payer: Self-pay | Admitting: Nurse Practitioner

## 2021-12-07 DIAGNOSIS — B9689 Other specified bacterial agents as the cause of diseases classified elsewhere: Secondary | ICD-10-CM

## 2021-12-07 MED ORDER — METRONIDAZOLE 500 MG PO TABS: 500.0000 mg | ORAL_TABLET | Freq: Two times a day (BID) | ORAL | 0 refills | Status: AC

## 2021-12-11 ENCOUNTER — Other Ambulatory Visit: Payer: Self-pay | Admitting: Nurse Practitioner

## 2021-12-11 DIAGNOSIS — E782 Mixed hyperlipidemia: Secondary | ICD-10-CM

## 2021-12-11 DIAGNOSIS — I10 Essential (primary) hypertension: Secondary | ICD-10-CM

## 2021-12-12 ENCOUNTER — Other Ambulatory Visit: Payer: Self-pay | Admitting: Nurse Practitioner

## 2021-12-12 DIAGNOSIS — R87610 Atypical squamous cells of undetermined significance on cytologic smear of cervix (ASC-US): Secondary | ICD-10-CM

## 2021-12-12 LAB — PAP IG AND CT-NG NAA
Chlamydia, Nuc. Acid Amp: NEGATIVE
Gonococcus by Nucleic Acid Amp: NEGATIVE

## 2021-12-17 ENCOUNTER — Telehealth: Payer: Self-pay | Admitting: Nurse Practitioner

## 2021-12-17 ENCOUNTER — Other Ambulatory Visit: Payer: Self-pay | Admitting: Nurse Practitioner

## 2021-12-17 DIAGNOSIS — K5909 Other constipation: Secondary | ICD-10-CM

## 2021-12-17 DIAGNOSIS — I10 Essential (primary) hypertension: Secondary | ICD-10-CM

## 2021-12-17 MED ORDER — GOLYTELY 236 G PO SOLR: 4.0000 L | Freq: Once | ORAL | 0 refills | Status: AC

## 2021-12-17 NOTE — Telephone Encounter (Signed)
Pt called stating she is trying to get in touch with [facility] or a nurse because: 1. She is trying to call back about lab results ?2. Art and garden tea and ballerina tea recommended by Enid Derry is making her stomach hurt and not helping constipation. Pt states she is feeling miserable.  ?

## 2022-01-03 DIAGNOSIS — R87610 Atypical squamous cells of undetermined significance on cytologic smear of cervix (ASC-US): Secondary | ICD-10-CM | POA: Insufficient documentation

## 2022-01-03 DIAGNOSIS — Z87898 Personal history of other specified conditions: Secondary | ICD-10-CM | POA: Insufficient documentation

## 2022-01-04 ENCOUNTER — Ambulatory Visit: Payer: BLUE CROSS/BLUE SHIELD | Admitting: Nurse Practitioner

## 2022-02-18 NOTE — Telephone Encounter (Signed)
error 

## 2022-12-10 ENCOUNTER — Inpatient Hospital Stay (HOSPITAL_COMMUNITY): Payer: BLUE CROSS/BLUE SHIELD

## 2022-12-10 ENCOUNTER — Inpatient Hospital Stay (HOSPITAL_COMMUNITY)
Admission: AD | Admit: 2022-12-10 | Discharge: 2022-12-11 | Disposition: A | Discharge status: Home / Self Care | Payer: BLUE CROSS/BLUE SHIELD | Attending: Obstetrics and Gynecology | Admitting: Obstetrics and Gynecology

## 2022-12-10 ENCOUNTER — Encounter (HOSPITAL_COMMUNITY): Payer: Self-pay | Admitting: Obstetrics and Gynecology

## 2022-12-10 DIAGNOSIS — N939 Abnormal uterine and vaginal bleeding, unspecified: Secondary | ICD-10-CM | POA: Diagnosis not present

## 2022-12-10 DIAGNOSIS — O0380 Unspecified complication following complete or unspecified spontaneous abortion: Secondary | ICD-10-CM | POA: Diagnosis not present

## 2022-12-10 DIAGNOSIS — Z79899 Other long term (current) drug therapy: Secondary | ICD-10-CM | POA: Diagnosis not present

## 2022-12-10 DIAGNOSIS — O3680X0 Pregnancy with inconclusive fetal viability, not applicable or unspecified: Secondary | ICD-10-CM

## 2022-12-10 DIAGNOSIS — Z880 Allergy status to penicillin: Secondary | ICD-10-CM | POA: Insufficient documentation

## 2022-12-10 DIAGNOSIS — O036 Delayed or excessive hemorrhage following complete or unspecified spontaneous abortion: Secondary | ICD-10-CM | POA: Diagnosis present

## 2022-12-10 DIAGNOSIS — Z3A08 8 weeks gestation of pregnancy: Secondary | ICD-10-CM

## 2022-12-10 LAB — CBC
HCT: 38 % (ref 36.0–46.0)
Hemoglobin: 12.2 g/dL (ref 12.0–15.0)
MCH: 28.6 pg (ref 26.0–34.0)
MCHC: 32.1 g/dL (ref 30.0–36.0)
MCV: 89.2 fL (ref 80.0–100.0)
Platelets: 199 10*3/uL (ref 150–400)
RBC: 4.26 MIL/uL (ref 3.87–5.11)
RDW: 12.2 % (ref 11.5–15.5)
WBC: 8.8 10*3/uL (ref 4.0–10.5)
nRBC: 0 % (ref 0.0–0.2)

## 2022-12-10 LAB — HCG, QUANTITATIVE, PREGNANCY: hCG, Beta Chain, Quant, S: 99 m[IU]/mL — ABNORMAL HIGH (ref <5)

## 2022-12-10 NOTE — MAU Note (Signed)
.  Krista Jones is a 37 y.o. 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 Has had bleeding ever since the procedure. Maybe changing pad 1-2 times a day no pain.  Over the weekend it was a little more and tonight she passed a very large clot and called EMS. Has a small amount of blood on perineum but nothing oozing out. Denies any pain or cramping just had pressure when the clot came out.   LMP:  Onset of complaint: tonight Pain score: 0 Vitals:   12/10/22 2046 12/10/22 2102  BP: (!) 140/93 124/75  Pulse: 97 88  Resp:    Temp:    SpO2: 100%       Lab orders placed from triage:  UPT

## 2022-12-10 NOTE — MAU Provider Note (Signed)
Chief Complaint:  No chief complaint on file.   Event Date/Time   First Provider Initiated Contact with Patient 12/10/22 2101       HPI: Krista Jones is a 37 y.o. [id number] who presents to maternity admissions reporting episode of heavy bleeding this evening.  Passed one large clot.  States had a surgical abortion in March.  Has had bleeding off and on since then.  Some days not requiring a pad, other days 2 pads per day.  Husband states bleeding has been heavy every day since abortion. Denies having any pain now. She reports vaginal bleeding, denies urinary symptoms, h/a, dizziness, n/v, or fever/chills.    States [facility] her oral contraceptives but the Rx never got to the pharmacy. They told her they could not give her the Patch because it would cause fibroids.  Does have a history of Hypertension.  Vaginal Bleeding The patient's primary symptoms include vaginal bleeding. The patient's pertinent negatives include no genital itching, genital odor or pelvic pain. This is a recurrent problem. The current episode started more than 1 month ago. The patient is experiencing no pain. Pertinent negatives include no abdominal pain, back pain, chills or fever. The vaginal discharge was bloody. The vaginal bleeding is heavier than menses. She has been passing clots. Nothing aggravates the symptoms. She has tried nothing for the symptoms. She is sexually active. She uses nothing for contraception.   RN Note: Krista Jones is a 37 y.o. 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 Has had bleeding ever since the procedure. Maybe changing pad 1-2 times a day no pain.  Over the weekend it was a little more and tonight she passed a very large clot and called EMS. Has a small amount of blood on perineum but nothing oozing out. Denies any pain or cramping just had pressure when the clot came out.   Past Medical History: Past Medical History:  Diagnosis  Date   Allergy 10/2019   Anxiety 02/2011   Depression 02/2011    Past obstetric history: OB History  Gravida Para Term Preterm AB Living  5 3 3   2 3   SAB IAB Ectopic Multiple Live Births    2     3    # Outcome Date GA Lbr Len/2nd Weight Sex Delivery Anes PTL Lv  5 IAB           4 IAB           3 Term      CS-LTranv   LIV  2 Term      CS-LTranv   LIV  1 Term         LIV    Past Surgical History: Past Surgical History:  Procedure Laterality Date   CESAREAN SECTION  03/26/2015   SALPINGECTOMY      Family History: Family History  Problem Relation Age of Onset   ADD / ADHD Mother    Anxiety disorder Mother    Depression Mother    Hypertension Mother    Obesity Mother    Alcohol abuse Father    Asthma Daughter     Social History: Social History   Tobacco Use   Smoking status: Never   Smokeless tobacco: Never  Vaping Use   Vaping Use: Never used  Substance Use Topics   Alcohol use: Never   Drug use: Never    Allergies:  Allergies  Allergen Reactions   Penicillins Itching  Pt stated it causes itching and yeast infections    Meds:  Medications Prior to Admission  Medication Sig Dispense Refill Last Dose   escitalopram (LEXAPRO) 20 MG tablet TAKE 1 TABLET BY MOUTH EVERY DAY 90 tablet 2 Past Month   topiramate (TOPAMAX) 100 MG tablet Take 100 mg by mouth daily.      atorvastatin (LIPITOR) 20 MG tablet TAKE 1 TABLET BY MOUTH EVERY DAY (Patient not taking: Reported on 12/10/2022) 90 tablet 3 Not Taking   butalbital-acetaminophen-caffeine (FIORICET) 50-325-40 MG tablet Take 1 tablet by mouth every 6 (six) hours as needed for headache. (Patient not taking: Reported on 12/10/2022) 14 tablet 0 Not Taking   losartan (COZAAR) 25 MG tablet TAKE 1 TABLET (25 MG TOTAL) BY MOUTH DAILY. (Patient not taking: Reported on 12/10/2022) 30 tablet 0 Not Taking   losartan-hydrochlorothiazide (HYZAAR) 50-12.5 MG tablet TAKE 1 TABLET BY MOUTH EVERY DAY (Patient not taking: Reported  on 12/10/2022) 90 tablet 3 Not Taking   methocarbamol (ROBAXIN) 500 MG tablet Take 1 tablet (500 mg total) by mouth 2 (two) times daily. (Patient not taking: Reported on 09/11/2021) 20 tablet 0    polyethylene glycol powder (GLYCOLAX/MIRALAX) 17 GM/SCOOP powder Take 17 g by mouth 2 (two) times daily as needed for mild constipation, moderate constipation or severe constipation. 3350 g 1     I have reviewed patient's Past Medical Hx, Surgical Hx, Family Hx, Social Hx, medications and allergies.  ROS:  Review of Systems  Constitutional:  Negative for chills and fever.  Gastrointestinal:  Negative for abdominal pain.  Genitourinary:  Positive for vaginal bleeding. Negative for pelvic pain.  Musculoskeletal:  Negative for back pain.   Other systems negative     Physical Exam  Patient Vitals for the past 24 hrs:  BP Temp Pulse Resp SpO2  12/10/22 2046 (!) 140/93 -- 97 -- 100 %  12/10/22 2042 131/88 -- (!) 105 -- --  12/10/22 2037 (!) 141/87 98.7 F (37.1 C) (!) 102 18 --   Constitutional: Well-developed, well-nourished female in no acute distress.  Cardiovascular: normal rate  Respiratory: normal effort, no distress.  GI: Abd soft, non-tender.  Nondistended.  No rebound, No guarding.   MS: Extremities nontender, no edema, normal ROM Neurologic: Alert and oriented x 4.   Grossly nonfocal. GU: Neg CVAT. Skin:  Warm and Dry Psych:  Affect appropriate.  PELVIC EXAM: Cervix pink, visually closed, without lesion, Moderate dark red blood in vault.  vaginal walls and external genitalia normal Bimanual exam: Cervix firm, anterior, neg CMT, uterus nontender, nonenlarged, adnexa without tenderness, enlargement, or mass  Exam is limited by body habitus.    Labs: Results for orders placed or performed during the hospital encounter of 12/10/22 (from the past 24 hour(s))  hCG, quantitative, pregnancy     Status: Abnormal   Collection Time: 12/10/22  9:41 PM  Result Value Ref Range   hCG, Beta  Chain, Quant, S 99 (H) <5 mIU/mL  CBC     Status: None   Collection Time: 12/10/22  9:41 PM  Result Value Ref Range   WBC 8.8 4.0 - 10.5 K/uL   RBC 4.26 3.87 - 5.11 MIL/uL   Hemoglobin 12.2 12.0 - 15.0 g/dL   HCT 81.1 91.4 - 78.2 %   MCV 89.2 80.0 - 100.0 fL   MCH 28.6 26.0 - 34.0 pg   MCHC 32.1 30.0 - 36.0 g/dL   RDW 95.6 21.3 - 08.6 %   Platelets 199 150 - 400  K/uL   nRBC 0.0 0.0 - 0.2 %     Imaging:  US OB LESS THAN 14 WEEKS WITH OB TRANSVAGINAL  Result Date: 12/10/2022 CLINICAL DATA:  Pregnancy with bleeding EXAM: OBSTETRIC <14 WK [place] AND TRANSVAGINAL OB US TECHNIQUE: Both transabdominal and transvaginal ultrasound examinations were performed for complete evaluation of the gestation as well as the maternal uterus, adnexal regions, and pelvic cul-de-sac. Transvaginal technique was performed to assess early pregnancy. COMPARISON:  None Available. FINDINGS: Intrauterine gestational sac: None Yolk sac:  Not Visualized. Embryo:  Not Visualized. Maternal uterus/adnexae: Nonvisualized left ovary. Right ovary measures 3.1 by 3.7 x 3 cm. No significant free fluid IMPRESSION: No IUP identified. Findings consistent with pregnancy of unknown location, differential of which includes IUP too early to visualize, recent failed pregnancy, and occult ectopic pregnancy. Recommend trending of HCG with repeat ultrasound as indicated Electronically Signed   By: Jasmine Pang M.D.   On: 12/10/2022 23:25     MAU Course/MDM: I have reviewed the triage vital signs and the nursing notes.   Pertinent labs & imaging results that were available during my care of the patient were reviewed by me and considered in my medical decision making (see chart for details).      I have reviewed her medical records including past results, notes and treatments.   I have ordered labs as follows: CBC and QHCG. Urine pregnancy test was negative.  HCG was 99. Imaging ordered: [place] which showed nothing in uterus Results reviewed.     Discussed results Recommend followup HCG in 2 days Message sent to office to schedule her for Friday morning Pt stable at time of discharge.  Assessment: Post abortion bleeding Positive HCG result Retained products of conceptions vs new pregnancy with bleeding  Plan: Discharge home Recommend monitor for hemorrhage, >2 pads per hour Follow up HCG on Friday morning Plan to be determined based on results  Encouraged to return here or to other Urgent Care/ED if she develops worsening of symptoms, increase in pain, fever, or other concerning symptoms.   Wynelle Bourgeois CNM, MSN Certified Nurse-Midwife 12/10/2022 9:02 PM

## 2022-12-11 ENCOUNTER — Encounter: Payer: Self-pay | Admitting: Advanced Practice Midwife

## 2022-12-11 DIAGNOSIS — N939 Abnormal uterine and vaginal bleeding, unspecified: Secondary | ICD-10-CM

## 2022-12-11 DIAGNOSIS — Z3A08 8 weeks gestation of pregnancy: Secondary | ICD-10-CM

## 2022-12-11 DIAGNOSIS — O0380 Unspecified complication following complete or unspecified spontaneous abortion: Secondary | ICD-10-CM | POA: Insufficient documentation

## 2022-12-11 DIAGNOSIS — O3680X0 Pregnancy with inconclusive fetal viability, not applicable or unspecified: Secondary | ICD-10-CM

## 2022-12-13 ENCOUNTER — Ambulatory Visit (INDEPENDENT_AMBULATORY_CARE_PROVIDER_SITE_OTHER): Payer: BLUE CROSS/BLUE SHIELD

## 2022-12-13 ENCOUNTER — Other Ambulatory Visit: Payer: Self-pay

## 2022-12-13 VITALS — BP 129/86 | HR 77

## 2022-12-13 DIAGNOSIS — O3680X0 Pregnancy with inconclusive fetal viability, not applicable or unspecified: Secondary | ICD-10-CM

## 2022-12-13 DIAGNOSIS — Z658 Other specified problems related to psychosocial circumstances: Secondary | ICD-10-CM

## 2022-12-13 DIAGNOSIS — N939 Abnormal uterine and vaginal bleeding, unspecified: Secondary | ICD-10-CM

## 2022-12-13 DIAGNOSIS — Z3A08 8 weeks gestation of pregnancy: Secondary | ICD-10-CM

## 2022-12-13 LAB — BETA HCG QUANT (REF LAB): hCG Quant: 87 m[IU]/mL

## 2022-12-13 NOTE — Patient Instructions (Addendum)
[facility]  (open 24/7) [address] [place], [place] [address] [phone]  [place] 24 Hour Crisis Line [phone]

## 2022-12-13 NOTE — Progress Notes (Signed)
Beta HCG Follow-up Visit  Krista Jones presents to [facility] for follow-up HCG lab. She was seen in MAU on 12/10/22 for ongoing vaginal bleeding follow vacuum aspiration TAB March 2024. Patient reports continues light bleeding today. Denies pain or fever; reports some pelvic pressure. Discussed with patient that we are following beta HCG levels today. I will call the patient with results.   Patient reports panic attacks since abortion. Seem to be related to seeing vaginal bleeding. Referral placed for Krista Jones. Urgent resources given if needed prior to appt with Krista Jones.  Beta HCG results: 12/10/22 99  12/13/22 87   Results reviewed by Krista Reese, MD, see recommendation below:   Krista Maples, MD  P [facility] Clinical Pool Patient seen in MAU two days prior with heavy vaginal bleeding, hcg 99 at that time. HCG 87 today, falling which is suspicious but not yet confirmatory for miscarriage. Recommend repeat hcg on Monday 12/16/22 in AM.  Called pt with results. Nurse visit scheduled for Monday, 12/16/22 for repeat HCG. Return to MAU precautions given.  Krista Jones 12/13/2022 8:44 AM

## 2022-12-16 ENCOUNTER — Ambulatory Visit (INDEPENDENT_AMBULATORY_CARE_PROVIDER_SITE_OTHER): Payer: BLUE CROSS/BLUE SHIELD | Admitting: *Deleted

## 2022-12-16 VITALS — BP 132/85 | HR 86 | Ht 61.0 in | Wt 203.2 lb

## 2022-12-16 DIAGNOSIS — Z3A00 Weeks of gestation of pregnancy not specified: Secondary | ICD-10-CM

## 2022-12-16 DIAGNOSIS — O3680X0 Pregnancy with inconclusive fetal viability, not applicable or unspecified: Secondary | ICD-10-CM | POA: Diagnosis not present

## 2022-12-16 LAB — BETA HCG QUANT (REF LAB): hCG Quant: 23 m[IU]/mL

## 2022-12-16 NOTE — Progress Notes (Signed)
Here for stat bchg.  Reports since Friday when she has last stat bhcg drawn continues to vary from light vaginal bleeding to spotting with urination only. States she continues to have same cramping she has been having since she went to hospital first time which is rated 3-5.  Explained we will draw stat bhcg, then I will call her once results received and reviewed with provider. She voices understanding. Nancy Fetter 5:20 stat bhcg results =23 received and reviewed with Dr. Donavan Foil who states appropriate drop in bhcg and recommends repeat non stat bhcg in 2 weeks , may offer fu with provider if desired. I called Juli and informed her of results and recommendations. She agreed to non stat bhcg lab appointment 11/30/22. I offered follow up which she would like and informed her I will send message to front office to schedule for after 12/30/22. She voices understanding. She also asked for letter for return to work which I placed in [place].  Nancy Fetter

## 2022-12-16 NOTE — Addendum Note (Signed)
Addended by: Gerome Apley on: 12/16/2022 05:41 PM   Modules accepted: Level of Service

## 2022-12-17 NOTE — BH Specialist Note (Deleted)
     Integrated Behavioral Health via Telemedicine Visit  12/17/2022 [name] [id number]  Number of Integrated Behavioral Health Clinician visits: No data recorded Session Start time: No data recorded  Session End time: No data recorded Total time in minutes: No data recorded  Referring Provider: Merian Capron, MD Patient/Family location: Home*** [facility] Provider location: [facility] Women  All persons participating in visit: Patient Krista Jones and [facility] [place] ***  Types of Service: {CHL AMB TYPE OF [phone]}  I connected with [name] and/or Burman Freestone Burbridge's {family members:20773} via  Telephone or Engineer, civil (consulting)  (Video is Caregility application) and verified that I am speaking with the correct person using two identifiers. Discussed confidentiality: Yes   I discussed the limitations of telemedicine and the availability of in person appointments.  Discussed there is a possibility of technology failure and discussed alternative modes of communication if that failure occurs.  I discussed that engaging in this telemedicine visit, they consent to the provision of behavioral healthcare and the services will be billed under their insurance.  Patient and/or legal guardian expressed understanding and consented to Telemedicine visit: Yes   Presenting Concerns: Patient and/or family reports the following symptoms/concerns: *** Duration of problem: ***; Severity of problem: {Mild/Moderate/Severe:20260}  Patient and/or Family's Strengths/Protective Factors: {CHL AMB BH PROTECTIVE FACTORS:[id number]}  Goals Addressed: Patient will:  Reduce symptoms of: {IBH Symptoms:21014056}   Increase knowledge and/or ability of: {IBH Patient Tools:21014057}   Demonstrate ability to: {IBH Goals:21014053}  Progress towards Goals: Ongoing  Interventions: Interventions utilized:  {IBH  Interventions:21014054} Standardized Assessments completed: {IBH Screening Tools:21014051}  Patient and/or Family Response: Patient agrees with treatment plan. ***  Assessment: Patient currently experiencing ***.   Patient may benefit from psychoeducation and brief therapeutic interventions regarding coping with symptoms of *** .  Plan: Follow up with behavioral health clinician on : *** Behavioral recommendations:  -*** -*** Referral(s): {IBH Referrals:21014055}  I discussed the assessment and treatment plan with the patient and/or parent/guardian. They were provided an opportunity to ask questions and all were answered. They agreed with the plan and demonstrated an understanding of the instructions.   They were advised to call back or seek an in-person evaluation if the symptoms worsen or if the condition fails to improve as anticipated.  Valetta Close Bird Tailor, LCSW  ***

## 2022-12-26 NOTE — BH Specialist Note (Signed)
Integrated Behavioral Health via Telemedicine Visit  01/02/2023 Krista Jones [id number]  Number of Integrated Behavioral Health Clinician visits: 1- Initial Visit  Session Start time: [phone]   Session End time: 0905  Total time in minutes: 11   Referring Provider: Merian Capron, MD Patient/Family location: Home [facility] Provider location: [facility]  All persons participating in visit: Patient Krista Jones and [facility] Krista Jones   Types of Service: Individual psychotherapy and Video visit  I connected with Krista Jones and/or [name]  n/a  via  Telephone or Engineer, civil (consulting)  (Video is Caregility application) and verified that I am speaking with the correct person using two identifiers. Discussed confidentiality: Yes   I discussed the limitations of telemedicine and the availability of in person appointments.  Discussed there is a possibility of technology failure and discussed alternative modes of communication if that failure occurs.  I discussed that engaging in this telemedicine visit, they consent to the provision of behavioral healthcare and the services will be billed under their insurance.  Patient and/or legal guardian expressed understanding and consented to Telemedicine visit: Yes ; pt consents to 15 minutes only due to being out of network with insurance  Presenting Concerns: Patient and/or family reports the following symptoms/concerns: Ongoing panic attacks, increasing after medical procedure that preceded "standing up in a pool of blood" at child's sports event, followed by days of blood clots, heart palpitations, headaches; poor sleep quality. Pt says Lexapro is not managing symptoms of anxiety with panic and depression at current dosage and worries about seeing PCP for increase due to high copay.   Duration of problem: Ongoing with recent increase; Severity of problem:  moderately  severe  Patient and/or Family's Strengths/Protective Factors: Sense of purpose  Goals Addressed: Patient will:  Reduce symptoms of: anxiety   Increase knowledge and/or ability of: stress reduction   Demonstrate ability to: Increase healthy adjustment to current life circumstances and Increase motivation to adhere to plan of care  Progress towards Goals: Ongoing  Interventions: Interventions utilized:  Solution-Focused Strategies and Psychoeducation and/or Health Education Standardized Assessments completed:  Not given today  Patient and/or Family Response: Patient agrees with treatment plan.   Assessment: Patient currently experiencing Generalized anxiety disorder; Major depressive disorder, recurrent, unspecified severity.   Patient may benefit from psychoeducation and brief therapeutic interventions regarding coping with symptoms of anxiety with panic and depression.  Plan: Follow up with behavioral health clinician on : Two weeks Behavioral recommendations:  -Continue taking Lexapro as prescribed by PCP -Set up appointment with PCP to discuss  any changes to [facility] medication (Lexapro) -Call insurance company to find psychiatrists in-network OR use 24/7 Behavioral Health crisis walk-in as needed (information on After Visit Summary) Referral(s): Integrated Art gallery manager (In Clinic) and [facility] (LME/Outside Clinic)  I discussed the assessment and treatment plan with the patient and/or parent/guardian. They were provided an opportunity to ask questions and all were answered. They agreed with the plan and demonstrated an understanding of the instructions.   They were advised to call back or seek an in-person evaluation if the symptoms worsen or if the condition fails to improve as anticipated.  Valetta Close Daanish Copes, LCSW

## 2022-12-30 ENCOUNTER — Other Ambulatory Visit: Payer: BLUE CROSS/BLUE SHIELD

## 2022-12-30 ENCOUNTER — Other Ambulatory Visit: Payer: Self-pay

## 2022-12-30 ENCOUNTER — Encounter: Payer: Self-pay | Admitting: Family Medicine

## 2022-12-30 DIAGNOSIS — O3680X0 Pregnancy with inconclusive fetal viability, not applicable or unspecified: Secondary | ICD-10-CM

## 2022-12-31 LAB — BETA HCG QUANT (REF LAB): hCG Quant: <1 m[IU]/mL

## 2023-01-02 ENCOUNTER — Ambulatory Visit: Payer: BLUE CROSS/BLUE SHIELD | Admitting: Clinical

## 2023-01-02 DIAGNOSIS — F339 Major depressive disorder, recurrent, unspecified: Secondary | ICD-10-CM

## 2023-01-02 DIAGNOSIS — F411 Generalized anxiety disorder: Secondary | ICD-10-CM

## 2023-01-02 NOTE — Patient Instructions (Signed)
Center for [facility] at [facility] for Women [address] [place], [place] [address] [phone] (main office) [phone] Manufacturing engineer office)  24/7 Behavioral Health Crisis Centers by [place]:  [place]:  [facility]: [facility] (24/7): [phone]   [address] [place], [place] [address]  [place]: - [facility] (24/7): [phone]   [address] [place], [place] [address]

## 2023-01-03 NOTE — BH Specialist Note (Signed)
Integrated Behavioral Health via Telemedicine Visit  01/16/2023 [name] [id number]  Number of Integrated Behavioral Health Clinician visits: 2- Second Visit  Session Start time: [phone]   Session End time: 0919  Total time in minutes: 30   Referring Provider: Merian Capron, MD Patient/Family location: [facility] Provider location: [facility] for [facility] at [facility] for Women  All persons participating in visit: Patient Krista Jones and [facility] [name]   Types of Service: Individual psychotherapy  I connected with [name] and/or [name]'s  n/a  via  Telephone or Engineer, civil (consulting)  (Video is Surveyor, mining) and verified that I am speaking with the correct person using two identifiers. Discussed confidentiality: Yes   I discussed the limitations of telemedicine and the availability of in person appointments.  Discussed there is a possibility of technology failure and discussed alternative modes of communication if that failure occurs.  I discussed that engaging in this telemedicine visit, they consent to the provision of behavioral healthcare and the services will be billed under their insurance.  Patient and/or legal guardian expressed understanding and consented to Telemedicine visit: Yes   Presenting Concerns: Patient and/or family reports the following symptoms/concerns: Processing thoughts and feelings regarding losing someone with cancer recently; fear with thoughts of "I'm gonna die" with any pain experienced; pt copes best with anxiety with water and exercise.  Duration of problem: Ongoing with recent increase; Severity of problem: severe  Patient and/or Family's Strengths/Protective Factors: Concrete supports in place (healthy food, safe environments, etc.) and Sense of purpose  Goals Addressed: Patient will:  Reduce symptoms of: anxiety, depression, and stress   Increase knowledge and/or ability of:  self-management skills and stress reduction   Demonstrate ability to: Increase healthy adjustment to current life circumstances and Increase motivation to adhere to plan of care  Progress towards Goals: Ongoing  Interventions: Interventions utilized:  Motivational Interviewing and Supportive Reflection Standardized Assessments completed: GAD-7 and PHQ 9  Patient and/or Family Response: Patient agrees with treatment plan.   Assessment: Patient currently experiencing Generalized anxiety disorder, Major depressive disorder, recurrent, moderate; Psychosocial stress.   Patient may benefit from continued therapeutic intervention  .  Plan: Follow up with behavioral health clinician on : Three weeks Behavioral recommendations:  -Continue plan to discuss with PCP any change in [facility] medication; continue taking Lexpapro as prescribed -Continue plan to find psychiatrist in-network OR use 24/7 [facility] walk-in hours as needed -Continue daily showers; exercise at work to manage current anxiety -Consider apps as discussed; use as needed -Remember FEAR ("Face Everything And Rise"), as discussed Referral(s): Integrated Art gallery manager (In Clinic) and [facility] (LME/Outside Clinic)  I discussed the assessment and treatment plan with the patient and/or parent/guardian. They were provided an opportunity to ask questions and all were answered. They agreed with the plan and demonstrated an understanding of the instructions.   They were advised to call back or seek an in-person evaluation if the symptoms worsen or if the condition fails to improve as anticipated.  Rae Lips, LCSW     01/16/2023    8:55 AM 12/03/2021    9:56 AM 09/11/2021   10:12 AM  Depression screen PHQ 2/9  Decreased Interest 1 0 3  Down, Depressed, Hopeless 1 0 2  PHQ - 2 Score 2 0 5  Altered sleeping 3  3  Tired, decreased energy 1  3  Change in appetite 1  3  Feeling bad or failure about  yourself  1  3  Trouble concentrating 1  3  Moving slowly or fidgety/restless 0  0  Suicidal thoughts 0  0  PHQ-9 Score 9  20      01/16/2023    8:58 AM  GAD 7 : Generalized Anxiety Score  Nervous, Anxious, on Edge 3  Control/stop worrying 3  Worry too much - different things 3  Trouble relaxing 3  Restless 3  Easily annoyed or irritable 3  Afraid - awful might happen 3  Total GAD 7 Score 21

## 2023-01-16 ENCOUNTER — Ambulatory Visit (INDEPENDENT_AMBULATORY_CARE_PROVIDER_SITE_OTHER): Payer: BLUE CROSS/BLUE SHIELD | Admitting: Clinical

## 2023-01-16 DIAGNOSIS — F411 Generalized anxiety disorder: Secondary | ICD-10-CM | POA: Diagnosis not present

## 2023-01-16 DIAGNOSIS — F331 Major depressive disorder, recurrent, moderate: Secondary | ICD-10-CM

## 2023-01-16 DIAGNOSIS — Z658 Other specified problems related to psychosocial circumstances: Secondary | ICD-10-CM

## 2023-01-16 NOTE — Patient Instructions (Signed)
[facility] [address] [place], [place] [address] [phone] (main office) [phone] (Ryane Konieczny's office)  /Emotional Wellbeing Apps and Websites Here are a few free apps meant to help you to help yourself.  To find, try searching on the internet to see if the app is offered on Apple/Android devices. If your first choice doesn't come up on your device, the good news is that there are many choices! Play around with different apps to see which ones are helpful to you.    Calm This is an app meant to help increase calm feelings. Includes info, strategies, and tools for tracking your feelings.      Calm Harm  This app is meant to help with self-harm. Provides many 5-minute or 15-min coping strategies for doing instead of hurting yourself.       Healthy Minds Health Minds is a problem-solving tool to help deal with emotions and cope with stress you encounter wherever you are.      MindShift This app can help people cope with anxiety. Rather than trying to avoid anxiety, you can make an important shift and face it.      MY3  MY3 features a support system, safety plan and resources with the goal of offering a tool to use in a time of need.       My Life My Voice  This mood journal offers a simple solution for tracking your thoughts, feelings and moods. Animated emoticons can help identify your mood.       Relax Melodies Designed to help with sleep, on this app you can mix sounds and meditations for relaxation.      Smiling Mind Smiling Mind is meditation made easy: it's a simple tool that helps put a smile on your mind.        Stop, Breathe & Think  A friendly, simple guide for people through meditations for mindfulness and compassion.  Stop, Breathe and Think Kids Enter your current feelings and choose a "mission" to help you cope. Offers videos for certain moods instead of just sound recordings.       Team  Orange The goal of this tool is to help teens change how they think, act, and react. This app helps you focus on your own good feelings and experiences.      The Virtual Hope Box The Virtual Hope Box (VHB) contains simple tools to help patients with coping, relaxation, distraction, and positive thinking.     

## 2023-02-03 ENCOUNTER — Encounter: Payer: Self-pay | Admitting: Obstetrics and Gynecology

## 2023-02-03 ENCOUNTER — Ambulatory Visit: Payer: BLUE CROSS/BLUE SHIELD | Admitting: Obstetrics and Gynecology

## 2023-02-04 ENCOUNTER — Ambulatory Visit: Payer: BLUE CROSS/BLUE SHIELD | Admitting: Clinical

## 2023-02-04 DIAGNOSIS — Z658 Other specified problems related to psychosocial circumstances: Secondary | ICD-10-CM

## 2023-02-04 DIAGNOSIS — F331 Major depressive disorder, recurrent, moderate: Secondary | ICD-10-CM

## 2023-02-04 DIAGNOSIS — F411 Generalized anxiety disorder: Secondary | ICD-10-CM

## 2023-02-04 NOTE — BH Specialist Note (Signed)
Integrated Behavioral Health via Telemedicine Visit  02/04/2023 Krista Jones [id number]  Number of Integrated Behavioral Health Clinician visits: 3- Third Visit  Session Start time: 0900   Session End time: 0915  Total time in minutes: 15 Pt agrees to 15 minutes only today due to insurance being out of network  Referring Provider: Merian Capron, MD Patient/Family location: [facility] Provider location: [facility]  All persons participating in visit: Patient Krista Jones and [facility] Krista Jones   Types of Service: Individual psychotherapy and Video visit  I connected with Krista Jones and/or Krista Jones's  n/a  via  Telephone or Engineer, civil (consulting)  (Video is Caregility application) and verified that I am speaking with the correct person using two identifiers. Discussed confidentiality: Yes   I discussed the limitations of telemedicine and the availability of in person appointments.  Discussed there is a possibility of technology failure and discussed alternative modes of communication if that failure occurs.  I discussed that engaging in this telemedicine visit, they consent to the provision of behavioral healthcare and the services will be billed under their insurance.  Patient and/or legal guardian expressed understanding and consented to Telemedicine visit: Yes ; pt agreed to 15 minutes only today due to being out of network  Presenting Concerns: Patient and/or family reports the following symptoms/concerns: Noticing that physical pain, caffeine and stress-eating tend to trigger anxiety; more heat-related headaches. Pt increasing scripture reading, prayer and switch to hot decaf tea have helped to manage anxiety. Pt says was told by PCP to continue taking Lexapro but no prescription sent to pharmacy.  Duration of problem: Ongoing; Severity of problem: severe  Patient and/or Family's  Strengths/Protective Factors: Concrete supports in place (healthy food, safe environments, etc.) and Sense of purpose  Goals Addressed: Patient will:  Reduce symptoms of: anxiety, depression, and stress   Increase knowledge and/or ability of: self-management skills   Demonstrate ability to: Increase healthy adjustment to current life circumstances  Progress towards Goals: Ongoing  Interventions: Interventions utilized:  Solution-Focused Strategies and Supportive Reflection Standardized Assessments completed: Not Needed  Patient and/or Family Response: Patient agrees with treatment plan.   Assessment: Patient currently experiencing Generalized anxiety disorder; Major depressive disorder, moderate; Psychosocial stress.   Patient may benefit from continued therapeutic intervention  .  Plan: Follow up with behavioral health clinician on : Two weeks Behavioral recommendations:  -Call PCP to request refill on Lexapro -Continue plan to establish care with psychiatrist for ongoing [facility] medication management -Continue using daily self-coping strategies as needed (hot showers, exercise at work, prayer, scripture, hot decaf tea, limiting caffeine) -Begin noticing thoughts and feelings prior to stress-eating. Consider replacing one "go to" unhealthy food with a healthy food replacement (ex. carrots instead of chips, etc.) at least once/week; notice how this makes your body feel.  Referral(s): Integrated [employer] (In Clinic)  I discussed the assessment and treatment plan with the patient and/or parent/guardian. They were provided an opportunity to ask questions and all were answered. They agreed with the plan and demonstrated an understanding of the instructions.   They were advised to call back or seek an in-person evaluation if the symptoms worsen or if the condition fails to improve as anticipated.  Krista Lips, LCSW     01/16/2023    8:55 AM 12/03/2021    9:56 AM  09/11/2021   10:12 AM  Depression screen PHQ 2/9  Decreased Interest 1 0 3  Down, Depressed, Hopeless 1 0 2  PHQ - 2 Score 2 0 5  Altered sleeping 3  3  Tired, decreased energy 1  3  Change in appetite 1  3  Feeling bad or failure about yourself  1  3  Trouble concentrating 1  3  Moving slowly or fidgety/restless 0  0  Suicidal thoughts 0  0  PHQ-9 Score 9  20      01/16/2023    8:58 AM  GAD 7 : Generalized Anxiety Score  Nervous, Anxious, on Edge 3  Control/stop worrying 3  Worry too much - different things 3  Trouble relaxing 3  Restless 3  Easily annoyed or irritable 3  Afraid - awful might happen 3  Total GAD 7 Score 21

## 2023-02-07 NOTE — BH Specialist Note (Signed)
Integrated Behavioral Health via Telemedicine Visit  02/18/2023 Krista Jones [id number]  Number of Integrated Behavioral Health Clinician visits: 2- Second Visit  Session Start time: [phone]   Session End time: 0851  Total time in minutes: 4 Pt consents to less than 5 minutes due to insurance being out of network  Referring Provider: Merian Capron, MD Patient/Family location: [facility] Provider location: [facility]  All persons participating in visit: Patient Krista Jones and [employer] Adriaan Maltese   Types of Service: Individual psychotherapy and Telephone visit  I connected with Krista Jones and/or Krista Jones's  n/a  via  Telephone or Engineer, civil (consulting)  (Video is Caregility application) and verified that I am speaking with the correct person using two identifiers. Discussed confidentiality: Yes   I discussed the limitations of telemedicine and the availability of in person appointments.  Discussed there is a possibility of technology failure and discussed alternative modes of communication if that failure occurs.  I discussed that engaging in this telemedicine visit, they consent to the provision of behavioral healthcare and the services will be billed under their insurance.  Patient and/or legal guardian expressed understanding and consented to Telemedicine visit: Yes   Presenting Concerns: Patient and/or family reports the following symptoms/concerns: Stress of going to the ED with chest tightness due to dehydration and low blood sugar; pt noticed after starting back on Lexapro, began having vivid dreams of her grandmother, so uncertainty about taking. Pt's goal is to figure out what is going on with her physical symptoms.  Duration of problem: Ongoing; Severity of problem: severe  Patient and/or Family's Strengths/Protective Factors: Concrete supports in place (healthy food, safe environments,  etc.) and Sense of purpose  Goals Addressed: Patient will:  Reduce symptoms of: anxiety, depression, and stress   Increase knowledge and/or ability of: stress reduction   Demonstrate ability to: Increase motivation to adhere to plan of care  Progress towards Goals: Ongoing  Interventions: Interventions utilized:  Motivational Interviewing Standardized Assessments completed: Not Needed  Patient and/or Family Response: Patient agrees with treatment plan.   Assessment: Patient currently experiencing Generalized anxiety disorder; Major depressive disorder, moderate; Psychosocial stress.   Patient may benefit from continued therapeutic intervention; establishing care with psychiatry .  Plan: Follow up with behavioral health clinician on : Call Krista Jones at [phone], as needed. Behavioral recommendations:  -Discuss Lexapro side effects with PCP -Continue plan to establish care with psychiatrist for ongoing BH medication management (Call insurance company to find out psychiatry in-network) -Continue daily self-coping strategies as needed (hot showers, exercises at work, prayer, scripture readings, hot decaf tea, limiting caffeine, replacing unhealthy snack with healthy snack) Referral(s): Integrated [employer] (In Clinic)  I discussed the assessment and treatment plan with the patient and/or parent/guardian. They were provided an opportunity to ask questions and all were answered. They agreed with the plan and demonstrated an understanding of the instructions.   They were advised to call back or seek an in-person evaluation if the symptoms worsen or if the condition fails to improve as anticipated.  Krista Close Lane Kjos, LCSW

## 2023-02-12 ENCOUNTER — Emergency Department (HOSPITAL_COMMUNITY): Payer: BLUE CROSS/BLUE SHIELD

## 2023-02-12 ENCOUNTER — Other Ambulatory Visit: Payer: Self-pay

## 2023-02-12 ENCOUNTER — Encounter (HOSPITAL_COMMUNITY): Payer: Self-pay

## 2023-02-12 ENCOUNTER — Emergency Department (HOSPITAL_COMMUNITY)
Admission: EM | Admit: 2023-02-12 | Discharge: 2023-02-12 | Disposition: A | Discharge status: Home / Self Care | Payer: BLUE CROSS/BLUE SHIELD | Attending: Emergency Medicine | Admitting: Emergency Medicine

## 2023-02-12 DIAGNOSIS — I10 Essential (primary) hypertension: Secondary | ICD-10-CM | POA: Insufficient documentation

## 2023-02-12 DIAGNOSIS — R079 Chest pain, unspecified: Secondary | ICD-10-CM | POA: Insufficient documentation

## 2023-02-12 DIAGNOSIS — Z79899 Other long term (current) drug therapy: Secondary | ICD-10-CM | POA: Insufficient documentation

## 2023-02-12 LAB — D-DIMER, QUANTITATIVE: D-Dimer, Quant: 0.31 ug/mL-FEU (ref 0.00–0.50)

## 2023-02-12 LAB — URINALYSIS, ROUTINE W REFLEX MICROSCOPIC
Bilirubin Urine: NEGATIVE
Glucose, UA: NEGATIVE mg/dL
Hgb urine dipstick: NEGATIVE
Ketones, ur: NEGATIVE mg/dL
Leukocytes,Ua: NEGATIVE
Nitrite: NEGATIVE
Protein, ur: NEGATIVE mg/dL
Specific Gravity, Urine: 1.009 (ref 1.005–1.030)
pH: 7 (ref 5.0–8.0)

## 2023-02-12 LAB — CBC
HCT: 40.7 % (ref 36.0–46.0)
Hemoglobin: 13 g/dL (ref 12.0–15.0)
MCH: 27.8 pg (ref 26.0–34.0)
MCHC: 31.9 g/dL (ref 30.0–36.0)
MCV: 87.2 fL (ref 80.0–100.0)
Platelets: 165 10*3/uL (ref 150–400)
RBC: 4.67 MIL/uL (ref 3.87–5.11)
RDW: 13.2 % (ref 11.5–15.5)
WBC: 4.6 10*3/uL (ref 4.0–10.5)
nRBC: 0 % (ref 0.0–0.2)

## 2023-02-12 LAB — TSH: TSH: 1.876 u[IU]/mL (ref 0.350–4.500)

## 2023-02-12 LAB — BASIC METABOLIC PANEL
Anion gap: 8 (ref 5–15)
BUN: 7 mg/dL (ref 6–20)
CO2: 25 mmol/L (ref 22–32)
Calcium: 9.2 mg/dL (ref 8.9–10.3)
Chloride: 104 mmol/L (ref 98–111)
Creatinine, Ser: 0.88 mg/dL (ref 0.44–1.00)
GFR, Estimated: >60 mL/min (ref >60)
Glucose, Bld: 101 mg/dL — ABNORMAL HIGH (ref 70–99)
Potassium: 3.8 mmol/L (ref 3.5–5.1)
Sodium: 137 mmol/L (ref 135–145)

## 2023-02-12 LAB — PREGNANCY, URINE: Preg Test, Ur: NEGATIVE

## 2023-02-12 LAB — TROPONIN I (HIGH SENSITIVITY)
Troponin I (High Sensitivity): <2 ng/L (ref <18)
Troponin I (High Sensitivity): <2 ng/L (ref <18)

## 2023-02-12 MED ORDER — ONDANSETRON HCL 4 MG/2ML IJ SOLN
4.0000 mg | Freq: Once | INTRAMUSCULAR | Status: AC
  Administered 2023-02-12: 4 mg via INTRAVENOUS
  Filled 2023-02-12: qty 2

## 2023-02-12 MED ORDER — LIDOCAINE VISCOUS HCL 2 % MT SOLN
15.0000 mL | Freq: Once | OROMUCOSAL | Status: DC
  Filled 2023-02-12: qty 15

## 2023-02-12 MED ORDER — SODIUM CHLORIDE 0.9 % IV BOLUS
1000.0000 mL | Freq: Once | INTRAVENOUS | Status: AC
  Administered 2023-02-12: 1000 mL via INTRAVENOUS

## 2023-02-12 MED ORDER — ALUM & MAG HYDROXIDE-SIMETH 200-200-20 MG/5ML PO SUSP
30.0000 mL | Freq: Once | ORAL | Status: DC
  Filled 2023-02-12: qty 30

## 2023-02-12 NOTE — ED Provider Notes (Signed)
[place] EMERGENCY DEPARTMENT AT [facility] Provider Note   CSN: [id number] Arrival date & time: 02/12/23  0725     History  Chief Complaint  Patient presents with   Chest Pain    Krista Jones is a 37 y.o. female status post salpingectomy, history of anxiety, prediabetes, hypertension presented with chest pain that began last night.  Patient states she called EMS and they said she was dehydrated last night he drinks water however patient states his symptoms are still persisting.  Patient states that she does get anxiety attacks as she does not take her Lexapro as prescribed however states that this is longer than her normal panic attacks.  Patient states her chest pain is substernal and endorses palpitations.  Patient notes that she had a recent salpingectomy within the last 3 months but denies leg swelling, shortness of breath.  Patient states that she continues to feel nauseous but has not had any episodes of emesis.  Patient denies LOC, headache, vision changes, new onset weakness, change in sensation/motor skills, fevers, recent illness  Home Medications Prior to Admission medications   Medication Sig Start Date End Date Taking? Authorizing Provider  atorvastatin (LIPITOR) 20 MG tablet TAKE 1 TABLET BY MOUTH EVERY DAY 12/11/21   Orion Crook I, NP  butalbital-acetaminophen-caffeine (FIORICET) 50-325-40 MG tablet Take 1 tablet by mouth every 6 (six) hours as needed for headache. 12/03/21   Passmore, Enid Derry I, NP  escitalopram (LEXAPRO) 20 MG tablet TAKE 1 TABLET BY MOUTH EVERY DAY 10/05/21   Passmore, Enid Derry I, NP  lactulose (CHRONULAC) 10 GM/15ML solution Take 20 g by mouth daily. 09/27/22   [provider]  losartan (COZAAR) 25 MG tablet TAKE 1 TABLET (25 MG TOTAL) BY MOUTH DAILY. Patient not taking: Reported on 12/10/2022 12/18/21 01/17/22  Orion Crook I, NP  losartan-hydrochlorothiazide (HYZAAR) 50-12.5 MG tablet TAKE 1 TABLET BY MOUTH EVERY DAY 12/11/21    Passmore, Enid Derry I, NP  methocarbamol (ROBAXIN) 500 MG tablet Take 1 tablet (500 mg total) by mouth 2 (two) times daily. 04/18/21   Jeannie Fend, PA-C  phentermine 37.5 MG capsule Take 37.5 mg by mouth every morning. 08/22/22   [provider]  polyethylene glycol powder (GLYCOLAX/MIRALAX) 17 GM/SCOOP powder Take 17 g by mouth 2 (two) times daily as needed for mild constipation, moderate constipation or severe constipation. 10/29/21   Passmore, Enid Derry I, NP  Semaglutide (OZEMPIC, 0.25 OR 0.5 MG/DOSE, [place]) Inject 0.5 mg into the skin once a week.    [provider]  topiramate (TOPAMAX) 100 MG tablet Take 100 mg by mouth daily.    [provider]  Vitamin D, Ergocalciferol, (DRISDOL) 1.25 MG (50000 UNIT) CAPS capsule Take 50,000 Units by mouth every 7 (seven) days. 08/22/22   [provider]      Allergies    Penicillins    Review of Systems   Review of Systems  Cardiovascular:  Positive for chest pain.    Physical Exam Updated Vital Signs BP 138/73   Pulse 73   Temp 98.6 F (37 C) (Oral)   Resp 17   SpO2 100%  Physical Exam Vitals reviewed.  Constitutional:      General: She is not in acute distress. HENT:     Head: Normocephalic and atraumatic.  Eyes:     Extraocular Movements: Extraocular movements intact.     Conjunctiva/sclera: Conjunctivae normal.     Pupils: Pupils are equal, round, and reactive to light.  Cardiovascular:  Rate and Rhythm: Normal rate and regular rhythm.     Pulses: Normal pulses.     Heart sounds: Normal heart sounds.     Comments: 2+ bilateral radial/dorsalis pedis pulses with regular rate Pulmonary:     Effort: Pulmonary effort is normal. No respiratory distress.     Breath sounds: Normal breath sounds.  Abdominal:     Palpations: Abdomen is soft.     Tenderness: There is no abdominal tenderness. There is no guarding or rebound.  Musculoskeletal:        General: Normal range of motion.     Cervical back:  Normal range of motion and neck supple.     Right lower leg: No edema.     Left lower leg: No edema.     Comments: 5 out of 5 bilateral grip/leg extension strength  Skin:    General: Skin is warm and dry.     Capillary Refill: Capillary refill takes less than 2 seconds.  Neurological:     General: No focal deficit present.     Mental Status: She is alert and oriented to person, place, and time.     Comments: Sensation intact in all 4 limbs  Psychiatric:        Mood and Affect: Mood normal.     ED Results / Procedures / Treatments   Labs (all labs ordered are listed, but only abnormal results are displayed) Labs Reviewed  BASIC METABOLIC PANEL - Abnormal; Notable for the following components:      Result Value   Glucose, Bld 101 (*)    All other components within normal limits  URINALYSIS, ROUTINE W REFLEX MICROSCOPIC - Abnormal; Notable for the following components:   Color, Urine STRAW (*)    All other components within normal limits  CBC  D-DIMER, QUANTITATIVE  TSH  PREGNANCY, URINE  TROPONIN I (HIGH SENSITIVITY)  TROPONIN I (HIGH SENSITIVITY)    EKG EKG Interpretation Date/Time:  Wednesday February 12 2023 07:38:45 EDT Ventricular Rate:  83 PR Interval:  163 QRS Duration:  81 QT Interval:  372 QTC Calculation: 438 R Axis:   25  Text Interpretation: Sinus rhythm Confirmed by Vonita Moss [phone]) on 02/12/2023 7:44:53 AM  Radiology DG Chest Port 1 View  Result Date: 02/12/2023 CLINICAL DATA:  cp EXAM: PORTABLE CHEST 1 VIEW COMPARISON:  CXR 10/11/16 FINDINGS: The heart size and mediastinal contours are within normal limits. Both lungs are clear. The visualized skeletal structures are unremarkable. IMPRESSION: No focal airspace opacity Electronically Signed   By: Lorenza Cambridge M.D.   On: 02/12/2023 08:38    Procedures Procedures    Medications Ordered in ED Medications  alum & mag hydroxide-simeth (MAALOX/MYLANTA) 200-200-20 MG/5ML suspension 30 mL (0 mLs Oral Hold  02/12/23 0910)    And  lidocaine (XYLOCAINE) 2 % viscous mouth solution 15 mL (0 mLs Oral Hold 02/12/23 0910)  ondansetron (ZOFRAN) injection 4 mg (4 mg Intravenous Given 02/12/23 1001)  sodium chloride 0.9 % bolus 1,000 mL (0 mLs Intravenous Stopped 02/12/23 1041)    ED Course/ Medical Decision Making/ A&P                             Medical Decision Making Amount and/or Complexity of Data Reviewed Labs: ordered. Radiology: ordered.  Risk OTC drugs. Prescription drug management.   [name] 37 y.o. presented today for chest pain. Working DDx that I considered at this time includes, but not limited  to, ACS, GERD, pulmonary embolism, community-acquired pneumonia, aortic dissection, pneumothorax, underlying bony abnormality, anemia, thyrotoxicosis, esophageal rupture.    R/o Dx: ACS, GERD, pulmonary embolism, community-acquired pneumonia, aortic dissection, pneumothorax, underlying bony abnormality, anemia, thyrotoxicosis, esophageal rupture: These are considered less likely due to history of present illness and physical exam findings. PE: D-dimer negative Aortic Dissection: less likely based on the location, quality, onset, and severity of symptoms in this case. Patient also has a lack of underlying history of AD or TAA.   Review of prior external notes: 12/10/2022 [facility] provider note  Unique Tests and My Interpretation:  EKG: Rate, rhythm, axis, intervals all examined and without medically relevant abnormality. ST segments without concerns for elevations Troponin: <2, less than 2 CXR: No acute cardiopulmonary changes CBC: Unremarkable BMP: Unremarkable TSH: Unremarkable D-dimer: Negative Urine pregnancy: Negative UA: Unremarkable  Discussion with Independent Historian:  Mother  Discussion of Management of Tests: None  Risk: Medium: prescription drug management  Risk Stratification Score: HEART: 0  Plan: On exam patient was in no acute distress. Patient's physical was  unremarkable and patient had stable vitals however her blood pressure was noted to be slightly elevated 143/89 which appears to be normal for. Labs and CXR will be ordered.  Patient reports recent salpingectomy along with palpitations so a TSH and a D-dimer were ordered to further evaluate.  Patient symptoms may be anxiety related as patient states that her chest tightness that she is experiencing does feel similar to previous anxiety attacks however states is never lasted this long.  Patient will be given Maalox and Zofran as she continues to feel nauseous along with IV fluids.  Patient stable at this time.  Patient's labs and imaging came back reassuring.  Initially patient did not want the Zofran however began feeling nauseated and then requested Zofran and her nausea has now resolved.  Patient is been in the ED for 4 hours and has had negative tropes.  At this time I think patient's chest discomfort is related to her anxiety as she does not seem compliant with her Lexapro.  I encouraged patient to follow-up with her primary care provider to see if there is another SSRI she may be placed on for anxiety.  Patient was given return precautions. Patient stable for discharge at this time.  Patient verbalized understanding of plan.         Final Clinical Impression(s) / ED Diagnoses Final diagnoses:  Chest pain, unspecified type    Rx / DC Orders ED Discharge Orders     None         Remi Deter 02/12/23 1134    Rondel Baton, MD 02/13/23 [phone]

## 2023-02-12 NOTE — Discharge Instructions (Signed)
Please follow-up with your primary care provider, recent symptoms and ER visit.  Today your labs and imaging are all reassuring and your chest pain is most likely anxiety related.  If symptoms change or worsen please return to ER.

## 2023-02-12 NOTE — ED Triage Notes (Signed)
Pt states she is having chest tightness and nauseated since yesterday morning, states suffers with anxiety, called EMS last night, they told her she was dehydrated and she drank water and still woke up with her same symptoms.

## 2023-02-18 ENCOUNTER — Ambulatory Visit: Payer: BLUE CROSS/BLUE SHIELD | Admitting: Clinical

## 2023-02-18 DIAGNOSIS — Z658 Other specified problems related to psychosocial circumstances: Secondary | ICD-10-CM

## 2023-02-18 DIAGNOSIS — F411 Generalized anxiety disorder: Secondary | ICD-10-CM

## 2023-02-18 DIAGNOSIS — F331 Major depressive disorder, recurrent, moderate: Secondary | ICD-10-CM

## 2023-02-18 NOTE — Patient Instructions (Signed)
[facility] [address] [place], [place] [address] [phone] (main office) [phone] (Chijioke Lasser's office)
